# Patient Record
Sex: Female | Born: 1960 | Race: White | Hispanic: No | Marital: Married | Smoking: Current some day smoker
Health system: Southern US, Community
[De-identification: ages and names within clinical notes are randomized; demographics above are authoritative.]

## PROBLEM LIST (undated history)

## (undated) DIAGNOSIS — Z6841 Body Mass Index (BMI) 40.0 and over, adult: Secondary | ICD-10-CM

## (undated) DIAGNOSIS — I493 Ventricular premature depolarization: Secondary | ICD-10-CM

## (undated) DIAGNOSIS — E785 Hyperlipidemia, unspecified: Secondary | ICD-10-CM

## (undated) DIAGNOSIS — J189 Pneumonia, unspecified organism: Secondary | ICD-10-CM

## (undated) DIAGNOSIS — G4730 Sleep apnea, unspecified: Secondary | ICD-10-CM

## (undated) DIAGNOSIS — K219 Gastro-esophageal reflux disease without esophagitis: Secondary | ICD-10-CM

## (undated) DIAGNOSIS — I82409 Acute embolism and thrombosis of unspecified deep veins of unspecified lower extremity: Secondary | ICD-10-CM

## (undated) HISTORY — DX: Ventricular premature depolarization: I49.3

## (undated) HISTORY — DX: Body Mass Index (BMI) 40.0 and over, adult: Z684

## (undated) HISTORY — DX: Morbid (severe) obesity due to excess calories: E66.01

## (undated) HISTORY — DX: Pneumonia, unspecified organism: J18.9

## (undated) HISTORY — DX: Gastro-esophageal reflux disease without esophagitis: K21.9

## (undated) HISTORY — DX: Sleep apnea, unspecified: G47.30

## (undated) HISTORY — DX: Acute embolism and thrombosis of unspecified deep veins of unspecified lower extremity: I82.409

## (undated) HISTORY — PX: RHINOPLASTY: SUR1284

## (undated) HISTORY — DX: Hyperlipidemia, unspecified: E78.5

## (undated) HISTORY — PX: SEPTOPLASTY: SUR1290

---

## 2003-07-26 HISTORY — PX: ABDOMINAL HYSTERECTOMY: SHX81

## 2014-02-22 DIAGNOSIS — I82409 Acute embolism and thrombosis of unspecified deep veins of unspecified lower extremity: Secondary | ICD-10-CM

## 2014-02-22 DIAGNOSIS — J189 Pneumonia, unspecified organism: Secondary | ICD-10-CM

## 2014-02-22 HISTORY — DX: Pneumonia, unspecified organism: J18.9

## 2014-02-22 HISTORY — DX: Acute embolism and thrombosis of unspecified deep veins of unspecified lower extremity: I82.409

## 2015-08-06 ENCOUNTER — Ambulatory Visit (INDEPENDENT_AMBULATORY_CARE_PROVIDER_SITE_OTHER): Payer: PRIVATE HEALTH INSURANCE

## 2015-08-06 ENCOUNTER — Ambulatory Visit (INDEPENDENT_AMBULATORY_CARE_PROVIDER_SITE_OTHER): Payer: PRIVATE HEALTH INSURANCE | Admitting: Family Medicine

## 2015-08-06 ENCOUNTER — Encounter: Payer: Self-pay | Admitting: Family Medicine

## 2015-08-06 VITALS — BP 122/86 | HR 82 | Temp 97.8°F | Resp 16 | Ht 67.0 in | Wt 273.2 lb

## 2015-08-06 DIAGNOSIS — R059 Cough, unspecified: Secondary | ICD-10-CM

## 2015-08-06 DIAGNOSIS — R05 Cough: Secondary | ICD-10-CM

## 2015-08-06 DIAGNOSIS — Z1211 Encounter for screening for malignant neoplasm of colon: Secondary | ICD-10-CM

## 2015-08-06 DIAGNOSIS — Z8249 Family history of ischemic heart disease and other diseases of the circulatory system: Secondary | ICD-10-CM | POA: Diagnosis not present

## 2015-08-06 DIAGNOSIS — I82221 Chronic embolism and thrombosis of inferior vena cava: Secondary | ICD-10-CM

## 2015-08-06 DIAGNOSIS — Z1231 Encounter for screening mammogram for malignant neoplasm of breast: Secondary | ICD-10-CM | POA: Diagnosis not present

## 2015-08-06 DIAGNOSIS — K219 Gastro-esophageal reflux disease without esophagitis: Secondary | ICD-10-CM

## 2015-08-06 DIAGNOSIS — J22 Unspecified acute lower respiratory infection: Secondary | ICD-10-CM

## 2015-08-06 DIAGNOSIS — Z13220 Encounter for screening for lipoid disorders: Secondary | ICD-10-CM

## 2015-08-06 DIAGNOSIS — J9801 Acute bronchospasm: Secondary | ICD-10-CM

## 2015-08-06 DIAGNOSIS — I493 Ventricular premature depolarization: Secondary | ICD-10-CM

## 2015-08-06 DIAGNOSIS — Z720 Tobacco use: Secondary | ICD-10-CM | POA: Diagnosis not present

## 2015-08-06 LAB — LIPID PANEL
Cholesterol: 258 mg/dL — ABNORMAL HIGH (ref 125–200)
HDL: 36 mg/dL — ABNORMAL LOW (ref >=46)
LDL Cholesterol: 188 mg/dL — ABNORMAL HIGH (ref <130)
Total CHOL/HDL Ratio: 7.2 Ratio — ABNORMAL HIGH (ref <=5.0)
Triglycerides: 168 mg/dL — ABNORMAL HIGH (ref <150)
VLDL: 34 mg/dL — AB (ref <30)

## 2015-08-06 LAB — COMPREHENSIVE METABOLIC PANEL
ALT: 15 U/L (ref 6–29)
AST: 16 U/L (ref 10–35)
Albumin: 4 g/dL (ref 3.6–5.1)
Alkaline Phosphatase: 60 U/L (ref 33–130)
BUN: 8 mg/dL (ref 7–25)
CHLORIDE: 106 mmol/L (ref 98–110)
CO2: 27 mmol/L (ref 20–31)
CREATININE: 0.65 mg/dL (ref 0.50–1.05)
Calcium: 9.4 mg/dL (ref 8.6–10.4)
GLUCOSE: 96 mg/dL (ref 65–99)
POTASSIUM: 4.6 mmol/L (ref 3.5–5.3)
SODIUM: 139 mmol/L (ref 135–146)
TOTAL PROTEIN: 6.7 g/dL (ref 6.1–8.1)
Total Bilirubin: 0.4 mg/dL (ref 0.2–1.2)

## 2015-08-06 LAB — TSH: TSH: 1.468 u[IU]/mL (ref 0.350–4.500)

## 2015-08-06 LAB — POCT CBC
Granulocyte percent: 60.8 %G (ref 37–80)
HCT, POC: 39.6 % (ref 37.7–47.9)
Hemoglobin: 13.6 g/dL (ref 12.2–16.2)
LYMPH, POC: 1.9 (ref 0.6–3.4)
MCH: 32.1 pg — AB (ref 27–31.2)
MCHC: 34.4 g/dL (ref 31.8–35.4)
MCV: 93.1 fL (ref 80–97)
MID (cbc): 0.6 (ref 0–0.9)
MPV: 7.3 fL (ref 0–99.8)
POC GRANULOCYTE: 4 (ref 2–6.9)
POC LYMPH PERCENT: 29.4 %L (ref 10–50)
POC MID %: 9.8 %M (ref 0–12)
Platelet Count, POC: 260 10*3/uL (ref 142–424)
RBC: 4.25 M/uL (ref 4.04–5.48)
RDW, POC: 13.3 %
WBC: 6.5 10*3/uL (ref 4.6–10.2)

## 2015-08-06 MED ORDER — HYDROCOD POLST-CPM POLST ER 10-8 MG/5ML PO SUER: 5.0000 mL | Freq: Two times a day (BID) | ORAL | Status: DC | PRN

## 2015-08-06 MED ORDER — PANTOPRAZOLE SODIUM 20 MG PO TBEC: 20.0000 mg | DELAYED_RELEASE_TABLET | Freq: Every day | ORAL | Status: DC

## 2015-08-06 MED ORDER — PREDNISONE 20 MG PO TABS: ORAL_TABLET | ORAL | Status: DC

## 2015-08-06 MED ORDER — METOPROLOL TARTRATE 25 MG PO TABS: 12.5000 mg | ORAL_TABLET | Freq: Two times a day (BID) | ORAL | Status: DC

## 2015-08-06 MED ORDER — ALBUTEROL SULFATE (2.5 MG/3ML) 0.083% IN NEBU
2.5000 mg | INHALATION_SOLUTION | Freq: Once | RESPIRATORY_TRACT | Status: AC
  Administered 2015-08-06: 2.5 mg via RESPIRATORY_TRACT

## 2015-08-06 MED ORDER — APIXABAN 5 MG PO TABS: 5.0000 mg | ORAL_TABLET | Freq: Two times a day (BID) | ORAL | Status: DC

## 2015-08-06 MED ORDER — DOXYCYCLINE HYCLATE 100 MG PO CAPS: 100.0000 mg | ORAL_CAPSULE | Freq: Two times a day (BID) | ORAL | Status: DC

## 2015-08-06 MED ORDER — ALBUTEROL SULFATE HFA 108 (90 BASE) MCG/ACT IN AERS: 2.0000 | INHALATION_SPRAY | Freq: Four times a day (QID) | RESPIRATORY_TRACT | Status: DC | PRN

## 2015-08-06 NOTE — Patient Instructions (Signed)

## 2015-08-06 NOTE — Progress Notes (Signed)
Subjective:    Patient ID: Leah Blevins, female    DOB: [birth year], 55 y.o.   MRN: [id number]  08/06/2015  Nasal Congestion; Headache; and Cough   HPI This 55 y.o. female presents for evaluation of nasal congestion and cough.  History of pneumonia with hospitalization in the past; thus, very cautious when gets ill.   Onset four days ago.  No fever but +chills/sweats.  Had a hot tottie last night to help with cough.  +HA.  +ear pain B; +sore throat.  Pain with swallowing.  +coughing with pain in upper airways which is really scaring patient.  +rhinorrhea; +nasal congestion green mostly. +loud cough; brown rusty color.  No bloody sputum.  +SOB; +Wheezing. No asthma or COPD/emphysema.  +tobacco x currently; total smoking 24 years.  No n/v/d.   S/p flu vaccine and pneumovax in 2015.  Has municex without much improvement.  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  Has been maintained on Eliquis for 1.5 years.  Taking Eliquis bid.  Mother with blood clots/DVT; maternal grandmother with pulmonary embolism.  S/p blood clotting disorder evaluation by hematology.  Did not follow-up with hematology. Prescribed bid but only takes 1/2 5mg  bid.  R lasts a long time.  Must be written a specific way.    PVCs: takes Metoprolol.  GERD: protonix for indigestion.  Will be in [place] for one year.  Last physical:  Years ago. 3 years ago Pap smear: Mammogram:  never Colonoscopy:  never TDAP:  Not sure Pneumovax:  2015 Zostavax:  2015 Influenza:  Not this year.  Review of Systems  Constitutional: Negative for fever, chills, diaphoresis and fatigue.  HENT: Positive for congestion, postnasal drip, rhinorrhea, sore throat and voice change. Negative for ear pain, sinus pressure and trouble swallowing.   Eyes: Negative for visual disturbance.  Respiratory: Positive for cough, shortness of breath and wheezing.   Cardiovascular: Negative for chest pain, palpitations and leg  swelling.  Gastrointestinal: Negative for nausea, vomiting, abdominal pain, diarrhea and constipation.  Endocrine: Negative for cold intolerance, heat intolerance, polydipsia, polyphagia and polyuria.  Skin: Negative for rash.  Neurological: Positive for headaches. Negative for dizziness, tremors, seizures, syncope, facial asymmetry, speech difficulty, weakness, light-headedness and numbness.    Past Medical History  Diagnosis Date  . Pneumonia 02/2014  . DVT (deep venous thrombosis) ([place]) 02/2014  . PVC (premature ventricular contraction)   . GERD (gastroesophageal reflux disease)    Past Surgical History  Procedure Laterality Date  . Cesarean section  1993  . Rhinoplasty    . Septoplasty Bilateral   . Abdominal hysterectomy  2005    uterine fibroids/DUB.  ovaries removed.   Allergies  Allergen Reactions  . Morphine And Related Nausea And Vomiting  . Penicillins Hives and Itching    Social History   Social History  . Marital Status: Married    Spouse Name: N/A  . Number of Children: N/A  . Years of Education: N/A   Occupational History  . Not on file.   Social History Main Topics  . Smoking status: Current Every Day Smoker -- 0.50 packs/day for 40 years    Types: Cigarettes  . Smokeless tobacco: Not on file  . Alcohol Use: 0.0 oz/week    0 Standard drinks or equivalent per week     Comment: sometimes- beer  . Drug Use: No  . Sexual Activity: Yes   Other Topics Concern  . Not on file   Social History Narrative  Marital status:  Married      Children: 24 year old son; no grandchildren.      Lives: with husband; travels in [place]; home is [place].       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      Tobacco: 1 ppd x 23 years      Alcohol: rarely; beers      Exercise:     Family History  Problem Relation Age of Onset  . Diabetes Mother   . Heart disease Mother 90    CABG/CAD  . Hyperlipidemia Mother   .  Hypertension Mother   . Cancer Father     vocal cords  . Heart disease Father 75    CABG  . Hyperlipidemia Father   . Hypertension Father   . Cancer Brother     vocal cords  . Heart disease Brother 40    CABG/CAD/stents       Objective:    BP 122/86 mmHg  Pulse 82  Temp(Src) 97.8 F (36.6 C) (Oral)  Resp 16  Ht 5\' 7"  (1.702 m)  Wt 273 lb 3.2 oz (123.923 kg)  BMI 42.78 kg/m2  SpO2 97% Physical Exam  Constitutional: She is oriented to person, place, and time. She appears well-developed and well-nourished. No distress.  obesity  HENT:  Head: Normocephalic and atraumatic.  Right Ear: External ear normal.  Left Ear: External ear normal.  Nose: Mucosal edema and rhinorrhea present. Right sinus exhibits no maxillary sinus tenderness and no frontal sinus tenderness. Left sinus exhibits no maxillary sinus tenderness and no frontal sinus tenderness.  Mouth/Throat: Uvula is midline, oropharynx is clear and moist and mucous membranes are normal.  +PND  Eyes: Conjunctivae and EOM are normal. Pupils are equal, round, and reactive to light.  Neck: Normal range of motion. Neck supple. Carotid bruit is not present. No thyromegaly present.  Cardiovascular: Normal rate, regular rhythm, normal heart sounds and intact distal pulses.  Exam reveals no gallop and no friction rub.   No murmur heard. Pulmonary/Chest: Effort normal and breath sounds normal. She has no wheezes. She has no rales.  Abdominal: Soft. Bowel sounds are normal. She exhibits no distension and no mass. There is no tenderness. There is no rebound and no guarding.  Lymphadenopathy:    She has no cervical adenopathy.  Neurological: She is alert and oriented to person, place, and time. No cranial nerve deficit.  Skin: Skin is warm and dry. No rash noted. She is not diaphoretic. No erythema. No pallor.  Psychiatric: She has a normal mood and affect. Her behavior is normal.   Results for orders placed or performed in visit on  08/06/15  POCT CBC  Result Value Ref Range   WBC 6.5 4.6 - 10.2 K/uL   Lymph, poc 1.9 0.6 - 3.4   POC LYMPH PERCENT 29.4 10 - 50 %L   MID (cbc) 0.6 0 - 0.9   POC MID % 9.8 0 - 12 %M   POC Granulocyte 4.0 2 - 6.9   Granulocyte percent 60.8 37 - 80 %G   RBC 4.25 4.04 - 5.48 M/uL   Hemoglobin 13.6 12.2 - 16.2 g/dL   HCT, POC 39.6 37.7 - 47.9 %   MCV 93.1 80 - 97 fL   MCH, POC 32.1 (A) 27 - 31.2 pg   MCHC 34.4 31.8 - 35.4 g/dL   RDW, POC 13.3 %   Platelet Count,  POC 260 142 - 424 K/uL   MPV 7.3 0 - 99.8 fL   [facility] reading (PRIMARY) by  Dr. Tamala Julian. CXR: NAD  ALBUTEROL NEBULIZER.      Assessment & Plan:   1. Cough   2. PVC (premature ventricular contraction)   3. Gastroesophageal reflux disease without esophagitis   4. 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 Screening, lipid   6. Family history of cardiovascular disease   7. Tobacco abuse   8. Bronchospasm   9. Lower respiratory infection   10. Colon cancer screening   11. Encounter for screening mammogram for breast cancer     1. Lower respiratory infection: New.  Rx for Doxycycline, Prednisone, Albuterol, and Tussionex provided. 2.  PVCs: stable; refill of Metoprolol provide.d 3.  GERD: controlled; refill provided. 4.  DVT inferior vena cava: Stable; refill provided.  Agreeable to lifelong anticoagulation due to mother and maternal grandmother with pulmonary embolism/DVT.  Reported negative evaluation in past for coagulopathy. 5.  Screening lipid: obtain FLP. 6.  Tobacco abuse: Highly encourage cessation. 7.  Family history of cardiovascular disease early: highly recommend smoking cessation, aggressive control of lipids, etc. 8.  Colon cancer screening: refer for colonoscopy. 9.  Schedule mammogram.   Orders Placed This Encounter  Procedures  . DG Chest 2 View    Standing Status: Future     Number of Occurrences: 1     Standing Expiration Date: 08/05/2016    Order Specific Question:   Reason for Exam (SYMPTOM  OR DIAGNOSIS REQUIRED)    Answer:  cough, DOE, history of pneumonia    Order Specific Question:  Is the patient pregnant?    Answer:  No    Order Specific Question:  Preferred imaging location?    Answer:  External  . MM Digital Screening    Standing Status: Future     Number of Occurrences:      Standing Expiration Date: 10/03/2016    Order Specific Question:  Reason for Exam (SYMPTOM  OR DIAGNOSIS REQUIRED)    Answer:  annual screening    Order Specific Question:  Is the patient pregnant?    Answer:  No    Order Specific Question:  Preferred imaging location?    Answer:  [facility]  . Comprehensive metabolic panel    Order Specific Question:  Has the patient fasted?    Answer:  Yes  . TSH  . Lipid panel    Order Specific Question:  Has the patient fasted?    Answer:  Yes  . Ambulatory referral to Gastroenterology    Referral Priority:  Routine    Referral Type:  Consultation    Referral Reason:  Specialty Services Required    Number of Visits Requested:  1  . POCT CBC   Meds ordered this encounter  Medications  . DISCONTD: apixaban (ELIQUIS) 2.5 MG TABS tablet    Sig: Take 2.5 mg by mouth 2 (two) times daily.  . Prenatal Vit-Fe Fumarate-FA (PRENATAL VITAMIN PO)    Sig: Take by mouth daily.  Marland Kitchen DISCONTD: pantoprazole (PROTONIX) 20 MG tablet    Sig: Take 20 mg by mouth daily.  . metoprolol succinate (TOPROL-XL) 25 MG 24 hr tablet    Sig: Take 12.5 mg by mouth 2 (two) times daily.  Marland Kitchen OVER THE COUNTER MEDICATION    Sig: 2 (two) times daily.  Marland Kitchen OVER THE COUNTER MEDICATION    Sig: daily.  Marland Kitchen albuterol (PROVENTIL) (2.5  MG/3ML) 0.083% nebulizer solution 2.5 mg    Sig:   . cyanocobalamin (,VITAMIN B-12,) 1000 MCG/ML injection    Sig: INJECT 1ML ONCE MONTHLY    Refill:  0  . phentermine (ADIPEX-P) 37.5 MG tablet    Sig: Take 37.5 mg by mouth daily.    Refill:  1  . CHANTIX 0.5 MG tablet    Sig: Take 0.5 mg by mouth 2 (two) times daily.     Refill:  0  . doxycycline (VIBRAMYCIN) 100 MG capsule    Sig: Take 1 capsule (100 mg total) by mouth 2 (two) times daily.    Dispense:  20 capsule    Refill:  0  . predniSONE (DELTASONE) 20 MG tablet    Sig: Three tablets daily x 2 days then two tablets daily x 5 days then one tablet daily x 5 days    Dispense:  21 tablet    Refill:  0  . chlorpheniramine-HYDROcodone (TUSSIONEX PENNKINETIC ER) 10-8 MG/5ML SUER    Sig: Take 5 mLs by mouth every 12 (twelve) hours as needed for cough.    Dispense:  180 mL    Refill:  0  . albuterol (PROVENTIL HFA;VENTOLIN HFA) 108 (90 Base) MCG/ACT inhaler    Sig: Inhale 2 puffs into the lungs every 6 (six) hours as needed for wheezing or shortness of breath (cough, shortness of breath or wheezing.).    Dispense:  1 Inhaler    Refill:  1  . DISCONTD: pantoprazole (PROTONIX) 20 MG tablet    Sig: Take 1 tablet (20 mg total) by mouth daily.    Dispense:  90 tablet    Refill:  3  . DISCONTD: metoprolol tartrate (LOPRESSOR) 25 MG tablet    Sig: Take 0.5 tablets (12.5 mg total) by mouth 2 (two) times daily.    Dispense:  90 tablet    Refill:  1  . DISCONTD: apixaban (ELIQUIS) 5 MG TABS tablet    Sig: Take 1 tablet (5 mg total) by mouth 2 (two) times daily.    Dispense:  180 tablet    Refill:  1  . apixaban (ELIQUIS) 5 MG TABS tablet    Sig: Take 1 tablet (5 mg total) by mouth 2 (two) times daily.    Dispense:  180 tablet    Refill:  1  . pantoprazole (PROTONIX) 20 MG tablet    Sig: Take 1 tablet (20 mg total) by mouth daily.    Dispense:  90 tablet    Refill:  3  . metoprolol tartrate (LOPRESSOR) 25 MG tablet    Sig: Take 0.5 tablets (12.5 mg total) by mouth 2 (two) times daily.    Dispense:  90 tablet    Refill:  1    No Follow-up on file.    Zeanna Sunde Elayne Guerin, M.D. [facility] [address] [place], [place]  [address] [phone] phone [phone] fax

## 2015-08-07 ENCOUNTER — Other Ambulatory Visit: Payer: Self-pay

## 2015-08-07 DIAGNOSIS — Z1231 Encounter for screening mammogram for malignant neoplasm of breast: Secondary | ICD-10-CM

## 2015-08-18 MED ORDER — ATORVASTATIN CALCIUM 10 MG PO TABS: 10.0000 mg | ORAL_TABLET | Freq: Every day | ORAL | Status: DC

## 2015-08-18 NOTE — Addendum Note (Signed)
Addended by: Wardell Honour on: 08/18/2015 01:14 PM   Modules accepted: Orders

## 2015-08-19 ENCOUNTER — Other Ambulatory Visit: Payer: Self-pay

## 2015-08-19 DIAGNOSIS — Z1231 Encounter for screening mammogram for malignant neoplasm of breast: Secondary | ICD-10-CM

## 2015-08-20 ENCOUNTER — Encounter: Payer: Self-pay | Admitting: Gastroenterology

## 2015-08-31 ENCOUNTER — Telehealth: Payer: Self-pay

## 2015-08-31 ENCOUNTER — Encounter: Payer: Self-pay | Admitting: Gastroenterology

## 2015-08-31 ENCOUNTER — Ambulatory Visit (INDEPENDENT_AMBULATORY_CARE_PROVIDER_SITE_OTHER): Payer: No Typology Code available for payment source | Admitting: Gastroenterology

## 2015-08-31 VITALS — BP 104/70 | HR 88 | Ht 67.0 in | Wt 267.4 lb

## 2015-08-31 DIAGNOSIS — Z1211 Encounter for screening for malignant neoplasm of colon: Secondary | ICD-10-CM | POA: Diagnosis not present

## 2015-08-31 MED ORDER — NA SULFATE-K SULFATE-MG SULF 17.5-3.13-1.6 GM/177ML PO SOLN: 1.0000 | Freq: Once | ORAL | Status: DC

## 2015-08-31 NOTE — Patient Instructions (Signed)
You will be set up for a colonoscopy for colon cancer screening. We will ask Dr. Bing Quarry about holding your eliquis for 2 days prior to the colonoscopy.

## 2015-08-31 NOTE — Telephone Encounter (Signed)
08/31/2015   RE: Leah Blevins DOB: 06-Sep-1960 MRN: [id number]   Dear Dr Tamala Julian,    We have scheduled the above patient for an endoscopic procedure. Our records show that she is on anticoagulation therapy.   Please advise as to how long the patient may come off her therapy of Eliquis prior to the procedure, which is scheduled for 10/19/15.  Please fax back/ or route to Perez Dirico at [phone].   Sincerely,    Christian Mate RN

## 2015-08-31 NOTE — Progress Notes (Signed)
HPI: This is a   very pleasant 55 year old woman   who was referred to me by Wardell Honour, MD  to evaluate  colon cancer screening .    Chief complaint is routine risk for colon cancer.  Mothers identical twin sister had colon cancer in early   No GI  bleeding,   No constipation or loose stools.  Smokes 1/2 pack per day.  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  She is on elquis since then.  This has been prescribed by a [place] PCP of hers.  She was sent to hematologist in [facility] and blood clotting workup done, it seems no specific blood clotting disorder was diagnosed however given her mother's history of blood clots she was recommended to remain on a blood thinner indefinitely.   Review of systems: Pertinent positive and negative review of systems were noted in the above HPI section. Complete review of systems was performed and was otherwise normal.   Past Medical History  Diagnosis Date  . Pneumonia 02/2014  . DVT (deep venous thrombosis) ([place]) 02/2014  . PVC (premature ventricular contraction)   . GERD (gastroesophageal reflux disease)   . HLD (hyperlipidemia)   . Sleep apnea   . Morbid obesity with BMI of 40.0-44.9, adult [facility])     Past Surgical History  Procedure Laterality Date  . Cesarean section  1993  . Rhinoplasty    . Septoplasty Bilateral   . Abdominal hysterectomy  2005    uterine fibroids/DUB.  ovaries removed.    Current Outpatient Prescriptions  Medication Sig Dispense Refill  . apixaban (ELIQUIS) 5 MG TABS tablet Take 1 tablet (5 mg total) by mouth 2 (two) times daily. 180 tablet 1  . atorvastatin (LIPITOR) 10 MG tablet Take 1 tablet (10 mg total) by mouth daily. 90 tablet 3  . CHANTIX 0.5 MG tablet Take 0.5 mg by mouth 2 (two) times daily.  0  . metoprolol tartrate (LOPRESSOR) 25 MG tablet Take 0.5 tablets (12.5 mg total) by mouth 2 (two) times daily. 90 tablet 1  . pantoprazole (PROTONIX) 20 MG tablet Take 1 tablet (20 mg total) by  mouth daily. 90 tablet 3  . Prenatal Vit-Fe Fumarate-FA (PRENATAL VITAMIN PO) Take by mouth daily.     No current facility-administered medications for this visit.    Allergies as of 08/31/2015 - Review Complete 08/31/2015  Allergen Reaction Noted  . Keflex [cephalexin] Hives 08/31/2015  . Morphine and related Nausea And Vomiting 08/06/2015  . Penicillins Hives and Itching 08/06/2015    Family History  Problem Relation Age of Onset  . Diabetes Mother   . Heart disease Mother 38    CABG/CAD  . Hyperlipidemia Mother   . Hypertension Mother   . Cancer Father     vocal cords  . Heart disease Father 76    CABG  . Hyperlipidemia Father   . Hypertension Father   . Cancer Brother     vocal cords  . Heart disease Brother 40    CABG/CAD/stents  . Colon cancer Maternal Aunt   . Colon polyps Father   . Colon polyps Mother   . Clotting disorder Mother     Social History   Social History  . Marital Status: Married    Spouse Name: N/A  . Number of Children: 1  . Years of Education: N/A   Occupational History  . workamper    Social History Main Topics  . Smoking status: Current Every  Day Smoker -- 0.50 packs/day for 40 years    Types: Cigarettes  . Smokeless tobacco: Never Used     Comment: vapor sig  . Alcohol Use: 0.0 oz/week    0 Standard drinks or equivalent per week     Comment: sometimes- beer  . Drug Use: No  . Sexual Activity: Yes   Other Topics Concern  . Not on file   Social History Narrative   Marital status:  Married      Children: 76 year old son; no grandchildren.      Lives: with husband; travels in [place]; home is [place].       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      Tobacco: 1 ppd x 23 years      Alcohol: rarely; beers      Exercise:       Physical Exam: BP 104/70 mmHg  Pulse 88  Ht 5\' 7"  (1.702 m)  Wt 267 lb 6 oz (121.281 kg)  BMI 41.87 kg/m2 Constitutional: generally  well-appearing Psychiatric: alert and oriented x3 Eyes: extraocular movements intact Mouth: oral pharynx moist, no lesions Neck: supple no lymphadenopathy Cardiovascular: heart regular rate and rhythm Lungs: clear to auscultation bilaterally Abdomen: soft, nontender, nondistended, no obvious ascites, no peritoneal signs, normal bowel sounds Extremities: no lower extremity edema bilaterally Skin: no lesions on visible extremities   Assessment and plan: 55 y.o. female with  routine risk for colon cancer, ongoing blood thinner use, morbid obesity  She is at routine risk for colon cancer however she is on a blood thinner and she understands that remaining on this blood thinner with protracted increased risk for procedural complications during a colonoscopy. We will communicate with her primary care physician about the safety of her stopping the blood thinner for 2 days prior to colonoscopy. I see no reason for any further blood tests or imaging studies prior to then.   Owens Loffler, MD [facility] 08/31/2015, 2:54 PM  Cc: Wardell Honour, MD

## 2015-09-01 ENCOUNTER — Ambulatory Visit
Admission: RE | Admit: 2015-09-01 | Discharge: 2015-09-01 | Disposition: A | Discharge status: Home / Self Care | Payer: No Typology Code available for payment source | Source: Ambulatory Visit | Attending: Family Medicine | Admitting: Family Medicine

## 2015-09-01 DIAGNOSIS — Z1231 Encounter for screening mammogram for malignant neoplasm of breast: Secondary | ICD-10-CM

## 2015-09-03 NOTE — Telephone Encounter (Signed)
Left message on machine to call back  

## 2015-09-03 NOTE — Telephone Encounter (Signed)
The patient has been notified of this information and all questions answered.

## 2015-09-03 NOTE — Telephone Encounter (Signed)
Please have patient HOLD Eliquis one day before procedure; thus, last dose of Eliquis should be 2 days before procedure.  Patient should reinitiate Eliquis 48 hours after procedure.

## 2015-09-16 ENCOUNTER — Telehealth: Payer: Self-pay

## 2015-09-16 NOTE — Telephone Encounter (Signed)
Patient is calling because she was seen in January for Bronchitis and states that the symptoms are starting to come back. She is coughing and has green mucus. Patient wants to know if she could get more antibiotic and cough medication.

## 2015-10-19 ENCOUNTER — Encounter: Payer: Self-pay | Admitting: Gastroenterology

## 2015-10-19 ENCOUNTER — Ambulatory Visit (AMBULATORY_SURGERY_CENTER): Payer: No Typology Code available for payment source | Admitting: Gastroenterology

## 2015-10-19 VITALS — BP 123/80 | HR 70 | Temp 98.0°F | Resp 13 | Ht 67.0 in | Wt 267.0 lb

## 2015-10-19 DIAGNOSIS — K635 Polyp of colon: Secondary | ICD-10-CM

## 2015-10-19 DIAGNOSIS — D124 Benign neoplasm of descending colon: Secondary | ICD-10-CM

## 2015-10-19 DIAGNOSIS — Z1211 Encounter for screening for malignant neoplasm of colon: Secondary | ICD-10-CM | POA: Diagnosis present

## 2015-10-19 DIAGNOSIS — D125 Benign neoplasm of sigmoid colon: Secondary | ICD-10-CM

## 2015-10-19 DIAGNOSIS — D123 Benign neoplasm of transverse colon: Secondary | ICD-10-CM | POA: Diagnosis not present

## 2015-10-19 MED ORDER — SODIUM CHLORIDE 0.9 % IV SOLN: 500.0000 mL | INTRAVENOUS | Status: DC

## 2015-10-19 NOTE — Progress Notes (Signed)
Called to room to assist during endoscopic procedure.  Patient ID and intended procedure confirmed with present staff. Received instructions for my participation in the procedure from the performing physician.  

## 2015-10-19 NOTE — Progress Notes (Signed)
To PACU  Pt alert and awake. Report to RN

## 2015-10-19 NOTE — Op Note (Signed)
[facility] Patient Name: Leah Blevins Procedure Date: 10/19/2015 10:32 AM MRN: [id number] Endoscopist: Milus Banister , MD Age: 55 Referring MD:  Date of Birth: 02-Aug-1960 Gender: Female Procedure:                Colonoscopy Indications:              Screening for colorectal malignant neoplasm Medicines:                Monitored Anesthesia Care Procedure:                Pre-Anesthesia Assessment:                           - Prior to the procedure, a History and Physical                            was performed, and patient medications and                            allergies were reviewed. The patient's tolerance of                            previous anesthesia was also reviewed. The risks                            and benefits of the procedure and the sedation                            options and risks were discussed with the patient.                            All questions were answered, and informed consent                            was obtained. Prior Anticoagulants: The patient has                            taken Eliquis (apixaban). ASA Grade Assessment: II                            - A patient with mild systemic disease. After                            reviewing the risks and benefits, the patient was                            deemed in satisfactory condition to undergo the                            procedure.                           After obtaining informed consent, the colonoscope  was passed under direct vision. Throughout the                            procedure, the patient's blood pressure, pulse, and                            oxygen saturations were monitored continuously. The                            Model CF-HQ190L [id number]) scope was introduced                            through the anus and advanced to the the cecum,                            identified by appendiceal orifice and ileocecal     valve. The colonoscopy was performed without                            difficulty. The patient tolerated the procedure                            well. The quality of the bowel preparation was                            excellent. The ileocecal valve, appendiceal                            orifice, and rectum were photographed. Scope In: 10:55:22 AM Scope Out: 11:12:47 AM Scope Withdrawal Time: 0 hours 14 minutes 27 seconds  Total Procedure Duration: 0 hours 17 minutes 25 seconds  Findings:      A 17 mm polyp was found in the transverse colon. The polyp was       semi-pedunculated. The polyp was removed with a hot snare. Resection and       retrieval were complete (jar 1). Area was successfully injected with       Spot (carbon black) for tattooing to aid in future localization.      Three sessile polyps were found in the sigmoid colon and descending       colon. The polyps were 4 to 10 mm in size. These polyps were removed       with a cold snare. Resection and retrieval were complete (jar 2).      Multiple small and large-mouthed diverticula were found in the entire       colon.      The exam was otherwise without abnormality on direct and retroflexion       views. Complications:            No immediate complications. Estimated Blood Loss:     Estimated blood loss: none. Impression:               - One 17 mm polyp in the transverse colon, removed                            with a hot snare. Resected and retrieved. Injected.                           -  Three 4 to 10 mm polyps in the sigmoid colon and                            in the descending colon, removed with a cold snare.                            Resected and retrieved.                           - Diverticulosis in the entire examined colon.                           - The examination was otherwise normal on direct                            and retroflexion views. Recommendation:           - Patient has a contact number  available for                            emergencies. The signs and symptoms of potential                            delayed complications were discussed with the                            patient. Return to normal activities tomorrow.                            Written discharge instructions were provided to the                            patient.                           - Resume previous diet.                           - Resume Eliquis (apixaban) at prior dose in 2 days.                           - Repeat colonoscopy is recommended. The                            colonoscopy date will be determined after pathology                            results from today's exam become available for                            review. Procedure Code(s):        --- Professional ---                           [phone], Colonoscopy, flexible; with removal of  tumor(s), polyp(s), or other lesion(s) by snare                            technique                           [id number], Colonoscopy, flexible; with directed                            submucosal injection(s), any substance CPT copyright 2016 American Medical Association. All rights reserved. Milus Banister, MD 10/19/2015 11:18:48 AM This report has been signed electronically. Number of Addenda: 0 Referring MD:      Kincius Theda Sers, MD

## 2015-10-19 NOTE — Patient Instructions (Signed)
YOU HAD AN ENDOSCOPIC PROCEDURE TODAY AT [facility]:   Refer to the procedure report that was given to you for any specific questions about what was found during the examination.  If the procedure report does not answer your questions, please call your gastroenterologist to clarify.  If you requested that your care partner not be given the details of your procedure findings, then the procedure report has been included in a sealed envelope for you to review at your convenience later.  YOU SHOULD EXPECT: Some feelings of bloating in the abdomen. Passage of more gas than usual.  Walking can help get rid of the air that was put into your GI tract during the procedure and reduce the bloating. If you had a lower endoscopy (such as a colonoscopy or flexible sigmoidoscopy) you may notice spotting of blood in your stool or on the toilet paper. If you underwent a bowel prep for your procedure, you may not have a normal bowel movement for a few days.  Please Note:  You might notice some irritation and congestion in your nose or some drainage.  This is from the oxygen used during your procedure.  There is no need for concern and it should clear up in a day or so.  SYMPTOMS TO REPORT IMMEDIATELY:   Following lower endoscopy (colonoscopy or flexible sigmoidoscopy):  Excessive amounts of blood in the stool  Significant tenderness or worsening of abdominal pains  Swelling of the abdomen that is new, acute  Fever of 100F or higher   For urgent or emergent issues, a gastroenterologist can be reached at any hour by calling [phone].   DIET: Your first meal following the procedure should be a small meal and then it is ok to progress to your normal diet. Heavy or fried foods are harder to digest and may make you feel nauseous or bloated.  Likewise, meals heavy in dairy and vegetables can increase bloating.  Drink plenty of fluids but you should avoid alcoholic beverages for 24  hours.  ACTIVITY:  You should plan to take it easy for the rest of today and you should NOT DRIVE or use heavy machinery until tomorrow (because of the sedation medicines used during the test).    FOLLOW UP: Our staff will call the number listed on your records the next business day following your procedure to check on you and address any questions or concerns that you may have regarding the information given to you following your procedure. If we do not reach you, we will leave a message.  However, if you are feeling well and you are not experiencing any problems, there is no need to return our call.  We will assume that you have returned to your regular daily activities without incident.  If any biopsies were taken you will be contacted by phone or by letter within the next 1-3 weeks.  Please call us at [phone] if you have not heard about the biopsies in 3 weeks.    SIGNATURES/CONFIDENTIALITY: You and/or your care partner have signed paperwork which will be entered into your electronic medical record.  These signatures attest to the fact that that the information above on your After Visit Summary has been reviewed and is understood.  Full responsibility of the confidentiality of this discharge information lies with you and/or your care-partner.  Polyps, diverticulosis, high fiber diet-handouts given  Repeat colonoscopy will be determined by pathology.  Resume Eliquis prescribed dose in 2 days 10/21/15.

## 2015-10-20 ENCOUNTER — Telehealth: Payer: Self-pay

## 2015-10-20 NOTE — Telephone Encounter (Signed)
Attempt post procedure follow up call. No answer, left vm message.

## 2015-10-23 ENCOUNTER — Encounter: Payer: Self-pay | Admitting: Gastroenterology

## 2016-03-05 ENCOUNTER — Ambulatory Visit: Payer: PRIVATE HEALTH INSURANCE

## 2016-03-10 ENCOUNTER — Telehealth: Payer: Self-pay

## 2016-03-10 NOTE — Telephone Encounter (Signed)
Pt was wondering if she could get a refill on her CHANTIX 0.5 MG tablet [id number]. [facility] like [place] [id number] [place], [place] - [address]. Please advise at  [phone]

## 2016-03-12 ENCOUNTER — Telehealth: Payer: Self-pay

## 2016-03-12 NOTE — Telephone Encounter (Signed)
Please contact patient concerning the refill of the Chantix. Pt is about to have a meltdown. She is out and doesn't want to wait for a refill. Please see previous message.

## 2016-03-14 NOTE — Telephone Encounter (Signed)
I don't see where I have ever prescribed Chantix for patient?

## 2016-03-14 NOTE — Telephone Encounter (Signed)
Looks like Dr Tamala Julian may have Rxd it in Nov 2016 (even though it says historical provider in [facility], it does have her as ordering provider when checking more detailed records). I called pt though and advised on VM that she needs to RTC for Med REfill check up, 6 mos f/up is overdue, and can discuss Chantix at that time. Advised to call to get Dr Thompson Caul sch and get details about same day appts.

## 2016-03-15 NOTE — Telephone Encounter (Signed)
See notes under 8/17 message.

## 2016-03-19 ENCOUNTER — Other Ambulatory Visit: Payer: Self-pay | Admitting: Family Medicine

## 2016-04-08 ENCOUNTER — Ambulatory Visit (INDEPENDENT_AMBULATORY_CARE_PROVIDER_SITE_OTHER): Payer: PRIVATE HEALTH INSURANCE | Admitting: Physician Assistant

## 2016-04-08 ENCOUNTER — Ambulatory Visit (INDEPENDENT_AMBULATORY_CARE_PROVIDER_SITE_OTHER): Payer: PRIVATE HEALTH INSURANCE

## 2016-04-08 VITALS — BP 124/76 | HR 81 | Temp 97.8°F | Resp 18 | Ht 67.0 in | Wt 269.0 lb

## 2016-04-08 DIAGNOSIS — R059 Cough, unspecified: Secondary | ICD-10-CM

## 2016-04-08 DIAGNOSIS — F17200 Nicotine dependence, unspecified, uncomplicated: Secondary | ICD-10-CM | POA: Diagnosis not present

## 2016-04-08 DIAGNOSIS — R05 Cough: Secondary | ICD-10-CM

## 2016-04-08 DIAGNOSIS — J209 Acute bronchitis, unspecified: Secondary | ICD-10-CM | POA: Diagnosis not present

## 2016-04-08 DIAGNOSIS — J449 Chronic obstructive pulmonary disease, unspecified: Secondary | ICD-10-CM

## 2016-04-08 LAB — POCT CBC
Granulocyte percent: 55.1 %G (ref 37–80)
HEMATOCRIT: 41.5 % (ref 37.7–47.9)
HEMOGLOBIN: 14.5 g/dL (ref 12.2–16.2)
LYMPH, POC: 2.7 (ref 0.6–3.4)
MCH, POC: 33 pg — AB (ref 27–31.2)
MCHC: 35 g/dL (ref 31.8–35.4)
MCV: 94.2 fL (ref 80–97)
MID (cbc): 0.5 (ref 0–0.9)
MPV: 7.6 fL (ref 0–99.8)
POC GRANULOCYTE: 3.9 (ref 2–6.9)
POC LYMPH %: 37.5 % (ref 10–50)
POC MID %: 7.4 %M (ref 0–12)
Platelet Count, POC: 277 10*3/uL (ref 142–424)
RBC: 4.4 M/uL (ref 4.04–5.48)
RDW, POC: 13 %
WBC: 7.1 10*3/uL (ref 4.6–10.2)

## 2016-04-08 MED ORDER — BENZONATATE 100 MG PO CAPS: 100.0000 mg | ORAL_CAPSULE | Freq: Three times a day (TID) | ORAL | 0 refills | Status: DC | PRN

## 2016-04-08 MED ORDER — ALBUTEROL SULFATE HFA 108 (90 BASE) MCG/ACT IN AERS: 2.0000 | INHALATION_SPRAY | RESPIRATORY_TRACT | 0 refills | Status: DC | PRN

## 2016-04-08 MED ORDER — AZITHROMYCIN 250 MG PO TABS: ORAL_TABLET | ORAL | 0 refills | Status: DC

## 2016-04-08 MED ORDER — VARENICLINE TARTRATE 1 MG PO TABS: 1.0000 mg | ORAL_TABLET | Freq: Two times a day (BID) | ORAL | 1 refills | Status: DC

## 2016-04-08 NOTE — Progress Notes (Signed)
Leah Blevins  MRN: [id number] DOB: 11-25-1960  Subjective:  Leah Blevins is a 55 y.o. female seen in office today for a chief complaint of headache, coughing, sneezing, low grade fever, and sore throat x 1 week. Has associated chills, fatigue, and myalgia. Cough has progressed to a productive cough with brown sputum. Notes the cough is worse at night. Pt has been exposed to a lot of [place]uees these past couple of weeks. Denies hemoptysis and recent travel.  Pt has tried dayquil and nyquil for one week with no relief.   Pt has had a history of double pneumonia in 2015 and ended up in the hospital. She notes that when she had this in 2015, she had very similar symptoms and is very concerned about getting it again. Pt smokes 0.5 pack a day. She has tried Chantix in January. It worked for her but then she stopped using it in March. She would like to try this again.   Review of Systems  Constitutional: Positive for appetite change ( decreased) and diaphoresis.  HENT: Positive for congestion ( green mucus has progressed to brown mucus) and ear pain ( pressure).   Respiratory: Positive for shortness of breath.   Cardiovascular: Negative for chest pain, palpitations and leg swelling.  Gastrointestinal: Negative for abdominal pain, constipation, nausea and vomiting.  Neurological: Positive for dizziness.    Patient Active Problem List   Diagnosis Date Noted  . Family history of cardiovascular disease 08/06/2015  . 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 Gastroesophageal reflux disease without esophagitis 08/06/2015  . PVC (premature ventricular contraction) 08/06/2015  . Tobacco abuse 08/06/2015    Current Outpatient Prescriptions on File Prior to Visit  Medication Sig Dispense Refill  . atorvastatin (LIPITOR) 10 MG tablet Take 1 tablet (10 mg total) by mouth daily. 90 tablet 3  . ELIQUIS 5 MG TABS tablet TAKE 1 TABLET BY MOUTH TWICE A DAY 180  tablet 0  . metoprolol tartrate (LOPRESSOR) 25 MG tablet TAKE 0.5 TABLETS (12.5 MG TOTAL) BY MOUTH 2 (TWO) TIMES DAILY. 90 tablet 0  . pantoprazole (PROTONIX) 20 MG tablet Take 1 tablet (20 mg total) by mouth daily. 90 tablet 3  . Prenatal Vit-Fe Fumarate-FA (PRENATAL VITAMIN PO) Take by mouth daily.     No current facility-administered medications on file prior to visit.     Allergies  Allergen Reactions  . Keflex [Cephalexin] Hives  . Morphine And Related Nausea And Vomiting  . Penicillins Hives and Itching   Social History   Social History  . Marital status: Married    Spouse name: N/A  . Number of children: 1  . Years of education: N/A   Occupational History  . workamper    Social History Main Topics  . Smoking status: Former Smoker    Packs/day: 0.50    Years: 40.00    Types: Cigarettes    Quit date: 09/29/2015  . Smokeless tobacco: Never Used     Comment: vapor sig  . Alcohol use 0.0 oz/week     Comment: sometimes- beer  . Drug use: No  . Sexual activity: Yes   Other Topics Concern  . Not on file   Social History Narrative   Marital status:  Married      Children: 82 year old son; no grandchildren.      Lives: with husband; travels in [place]; home is [place].       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      Tobacco: 1 ppd x 23 years      Alcohol: rarely; beers      Exercise:      Objective:  BP 124/76   Pulse 81   Temp 97.8 F (36.6 C) (Oral)   Resp 18   Ht 5\' 7"  (1.702 m)   Wt 269 lb (122 kg)   SpO2 95%   BMI 42.13 kg/m   Physical Exam  Constitutional: She is oriented to person, place, and time.  Well developed, well nourished female appearing uncomfortable on exam table.    HENT:  Head: Normocephalic and atraumatic.  Right Ear: Tympanic membrane, external ear and ear canal normal.  Left Ear: Tympanic membrane, external ear and ear canal normal.  Nose: Mucosal edema present. Right sinus  exhibits maxillary sinus tenderness. Right sinus exhibits no frontal sinus tenderness. Left sinus exhibits maxillary sinus tenderness. Left sinus exhibits no frontal sinus tenderness.  Mouth/Throat: Uvula is midline and mucous membranes are normal. Posterior oropharyngeal erythema present.  Eyes: Conjunctivae are normal.  Neck: Trachea normal and normal range of motion.  Cardiovascular: Normal rate, regular rhythm, normal heart sounds and normal pulses.   Pulmonary/Chest: Effort normal. She has no wheezes.  Course breath sounds auscultated in posterior and anterior lung fields diffusely  Lymphadenopathy:       Head (right side): No submental, no submandibular, no tonsillar, no preauricular, no posterior auricular and no occipital adenopathy present.       Head (left side): No submental, no submandibular, no tonsillar, no preauricular, no posterior auricular and no occipital adenopathy present.    She has no cervical adenopathy.       Right: No supraclavicular adenopathy present.       Left: No supraclavicular adenopathy present.  Neurological: She is alert and oriented to person, place, and time. Gait normal.  Skin: Skin is warm and dry.  Psychiatric: Affect normal.  Vitals reviewed.   Results for orders placed or performed in visit on 04/08/16 (from the past 24 hour(s))  POCT CBC     Status: Abnormal   Collection Time: 04/08/16 11:12 AM  Result Value Ref Range   WBC 7.1 4.6 - 10.2 K/uL   Lymph, poc 2.7 0.6 - 3.4   POC LYMPH PERCENT 37.5 10 - 50 %L   MID (cbc) 0.5 0 - 0.9   POC MID % 7.4 0 - 12 %M   POC Granulocyte 3.9 2 - 6.9   Granulocyte percent 55.1 37 - 80 %G   RBC 4.40 4.04 - 5.48 M/uL   Hemoglobin 14.5 12.2 - 16.2 g/dL   HCT, POC 41.5 37.7 - 47.9 %   MCV 94.2 80 - 97 fL   MCH, POC 33.0 (A) 27 - 31.2 pg   MCHC 35.0 31.8 - 35.4 g/dL   RDW, POC 13.0 %   Platelet Count, POC 277 142 - 424 K/uL   MPV 7.6 0 - 99.8 fL    Dg Chest 2 View  Result Date: 04/08/2016 CLINICAL  DATA:  Worsening cough for 1 week. EXAM: CHEST  2 VIEW COMPARISON:  PA and lateral chest 08/06/2015. FINDINGS: The chest is hyperexpanded but the lungs are clear. Heart size is normal. No pneumothorax or pleural effusion. Aortic atherosclerosis is noted. No focal bony abnormality. IMPRESSION: No acute disease. COPD. Atherosclerosis. Electronically Signed   By: Inge Rise M.D.   On: 04/08/2016 11:24  Assessment and Plan :  1. Cough - DG Chest 2 View - POCT CBC - benzonatate (TESSALON) 100 MG capsule; Take 1-2 capsules (100-200 mg total) by mouth 3 (three) times daily as needed for cough.  Dispense: 40 capsule; Refill: 0  2. Tobacco use disorder - varenicline (CHANTIX) 1 MG tablet; Take 1 tablet (1 mg total) by mouth 2 (two) times daily. 0.5 mg PO qd x3 days, then 0.5 mg PO bid x4 days; then 1 mg twice daily x11 wks  Dispense: 60 tablet; Refill: 1  3. Acute bronchitis, unspecified organism -Due to patient's history and current symptoms, will cover with antibiotic  - azithromycin (ZITHROMAX) 250 MG tablet; Take 2 tabs PO x 1 dose, then 1 tab PO QD x 4 days  Dispense: 6 tablet; Refill: 0 - albuterol (PROVENTIL HFA;VENTOLIN HFA) 108 (90 Base) MCG/ACT inhaler; Inhale 2 puffs into the lungs every 4 (four) hours as needed for wheezing or shortness of breath (cough, shortness of breath or wheezing.).  Dispense: 1 Inhaler; Refill: 0  4. Chronic obstructive pulmonary disease, unspecified COPD type ([place]) -Pt informed that CXR findings are consistent with underlying COPD. She would like referral to pulm for further evaluation.  - Ambulatory referral to Pulmonology   Tenna Delaine PA-C  [facility] 04/08/2016 11:27 AM

## 2016-04-08 NOTE — Patient Instructions (Addendum)
Smoking cessation Chantix instructions: Start: 0.5 mg (half tablet) by mouth daily x3 days, then 0.5 mg by mouth twice a day (one tablet)  x4 days; then 2 mg (1 tablet twice a day) every day x 11 weeks ; Info: give w/ food; start drug 1wk before quit date if quit date planned; stop smoking 8-35 days after starting drug if quit date unplanned; initial tx = 12wk (1 starting pack + 2 continuing packs); may cont. additional 12wk if initial tx successful    -Take antibiotics as prescribed. Use inhaler every 4-6 hours as needed. Take tessalon perles for cough up to three times a day.  - I recommend you rest, drink plenty of fluids, eat light meals including soups.  - You may also use Tylenol or ibuprofen over-the-counter for your sore throat.  - Please let me know if you are not seeing any improvement or get worse.      IF you received an x-ray today, you will receive an invoice from [facility]. Please contact [facility] at [phone] with questions or concerns regarding your invoice.   IF you received labwork today, you will receive an invoice from [employer]. Please contact [facility] at [phone] with questions or concerns regarding your invoice.   Our billing staff will not be able to assist you with questions regarding bills from these companies.  You will be contacted with the lab results as soon as they are available. The fastest way to get your results is to activate your My Chart account. Instructions are located on the last page of this paperwork. If you have not heard from [place] regarding the results in 2 weeks, please contact this office.

## 2016-04-09 ENCOUNTER — Encounter: Payer: Self-pay | Admitting: Physician Assistant

## 2016-04-11 ENCOUNTER — Telehealth: Payer: Self-pay

## 2016-04-11 NOTE — Telephone Encounter (Signed)
PATIENT STATES SHE WAS IN THE OFFICE TO SEE [facility] ON Friday FOR BRONCHITIS. SHE PRESCRIBED HER A Z-PACK WHICH IS NOT HELPING HER AT ALL. SHE HAS BEEN IN THE BED FOR 3 DAYS. SHE STILL FEELS RUN DOWN WITH BODY ACHES. SHE IS PRONE TO GET PNEUMONIA AND SHE FEELS LIKE SHE SHOULD BE DOING MUCH BETTER AFTER TAKING THE ANTIBIOTIC. SHE NEEDS TO GET SOMETHING STRONGER. SHE WAS GIVEN A NOTE TO RETURN BACK TO WORK ON Wednesday BUT SHE WILL NOT BE ABLE TO GO WITH THE WAY SHE FEELS NOW.  BEST PHONE [phone] (CELL)  [facility] [address]  [place]

## 2016-04-12 NOTE — Telephone Encounter (Signed)
Pt called again in regards to this.  She is calling out of work Architectural technologist.

## 2016-04-13 NOTE — Telephone Encounter (Signed)
Pt contacted. She has completed course of antibiotics and states that her body aches have resolved. Her cough has also gotten a lot better. She denies fever and chills. States she is just still fatigued. She was supposed to go back to work tomorrow for a 12 hour shift but would like to have until Monday off so she can adequately rest. I will give her a work note through [employer] to be off until Monday. Pt to contact office if she has any more questions.

## 2016-06-16 ENCOUNTER — Other Ambulatory Visit: Payer: Self-pay | Admitting: Family Medicine

## 2016-06-22 ENCOUNTER — Other Ambulatory Visit: Payer: Self-pay | Admitting: Family Medicine

## 2016-06-25 NOTE — Telephone Encounter (Signed)
03/2016 last ov 07/2015 last labs

## 2016-07-06 ENCOUNTER — Other Ambulatory Visit: Payer: Self-pay | Admitting: Family Medicine

## 2016-07-11 NOTE — Telephone Encounter (Signed)
Call --- provided with one month supply of Eliquis only.  Will be due for follow-up visit with me in upcoming month; last visit with me 07/2015.  Please schedule OV with me in January 2018.

## 2016-07-12 NOTE — Telephone Encounter (Signed)
NUMBER IS D/C

## 2016-08-05 ENCOUNTER — Other Ambulatory Visit: Payer: Self-pay | Admitting: Family Medicine

## 2016-08-29 ENCOUNTER — Other Ambulatory Visit: Payer: Self-pay | Admitting: Family Medicine

## 2017-03-03 ENCOUNTER — Ambulatory Visit: Payer: PRIVATE HEALTH INSURANCE | Admitting: Family Medicine

## 2017-03-08 ENCOUNTER — Ambulatory Visit (INDEPENDENT_AMBULATORY_CARE_PROVIDER_SITE_OTHER): Payer: PRIVATE HEALTH INSURANCE | Admitting: Family Medicine

## 2017-03-08 ENCOUNTER — Encounter: Payer: Self-pay | Admitting: Family Medicine

## 2017-03-08 ENCOUNTER — Ambulatory Visit (INDEPENDENT_AMBULATORY_CARE_PROVIDER_SITE_OTHER): Payer: PRIVATE HEALTH INSURANCE

## 2017-03-08 VITALS — BP 127/90 | HR 76 | Temp 97.6°F | Resp 18 | Ht 68.11 in | Wt 258.0 lb

## 2017-03-08 DIAGNOSIS — I493 Ventricular premature depolarization: Secondary | ICD-10-CM | POA: Diagnosis not present

## 2017-03-08 DIAGNOSIS — M25561 Pain in right knee: Secondary | ICD-10-CM

## 2017-03-08 DIAGNOSIS — I82221 Chronic embolism and thrombosis of inferior vena cava: Secondary | ICD-10-CM

## 2017-03-08 DIAGNOSIS — G4733 Obstructive sleep apnea (adult) (pediatric): Secondary | ICD-10-CM | POA: Diagnosis not present

## 2017-03-08 DIAGNOSIS — I10 Essential (primary) hypertension: Secondary | ICD-10-CM

## 2017-03-08 DIAGNOSIS — Z1159 Encounter for screening for other viral diseases: Secondary | ICD-10-CM | POA: Diagnosis not present

## 2017-03-08 DIAGNOSIS — E7800 Pure hypercholesterolemia, unspecified: Secondary | ICD-10-CM | POA: Diagnosis not present

## 2017-03-08 DIAGNOSIS — K219 Gastro-esophageal reflux disease without esophagitis: Secondary | ICD-10-CM

## 2017-03-08 DIAGNOSIS — Z8249 Family history of ischemic heart disease and other diseases of the circulatory system: Secondary | ICD-10-CM | POA: Diagnosis not present

## 2017-03-08 DIAGNOSIS — Z131 Encounter for screening for diabetes mellitus: Secondary | ICD-10-CM

## 2017-03-08 DIAGNOSIS — Z114 Encounter for screening for human immunodeficiency virus [HIV]: Secondary | ICD-10-CM

## 2017-03-08 DIAGNOSIS — Z720 Tobacco use: Secondary | ICD-10-CM | POA: Diagnosis not present

## 2017-03-08 LAB — POCT URINALYSIS DIP (MANUAL ENTRY)
Bilirubin, UA: NEGATIVE
GLUCOSE UA: NEGATIVE mg/dL
Leukocytes, UA: NEGATIVE
NITRITE UA: NEGATIVE
RBC UA: NEGATIVE
Spec Grav, UA: 1.015 (ref 1.010–1.025)
UROBILINOGEN UA: 0.2 U/dL
pH, UA: 7 (ref 5.0–8.0)

## 2017-03-08 MED ORDER — APIXABAN 5 MG PO TABS: 5.0000 mg | ORAL_TABLET | Freq: Two times a day (BID) | ORAL | 1 refills | Status: DC

## 2017-03-08 MED ORDER — ATORVASTATIN CALCIUM 10 MG PO TABS: 10.0000 mg | ORAL_TABLET | Freq: Every day | ORAL | 1 refills | Status: DC

## 2017-03-08 MED ORDER — METOPROLOL TARTRATE 25 MG PO TABS
ORAL_TABLET | ORAL | 1 refills | Status: DC
Start: 2017-03-08 — End: 2017-09-12

## 2017-03-08 MED ORDER — VARENICLINE TARTRATE 1 MG PO TABS
1.0000 mg | ORAL_TABLET | Freq: Two times a day (BID) | ORAL | 1 refills | Status: DC
Start: 2017-03-08 — End: 2017-03-25

## 2017-03-08 MED ORDER — PANTOPRAZOLE SODIUM 20 MG PO TBEC: 20.0000 mg | DELAYED_RELEASE_TABLET | Freq: Every day | ORAL | 1 refills | Status: DC

## 2017-03-08 MED ORDER — DICLOFENAC SODIUM 1 % TD GEL: 4.0000 g | Freq: Four times a day (QID) | TRANSDERMAL | 2 refills | Status: AC

## 2017-03-08 NOTE — Patient Instructions (Addendum)
IF you received an x-ray today, you will receive an invoice from [facility]. Please contact [facility] at [phone] with questions or concerns regarding your invoice.   IF you received labwork today, you will receive an invoice from [facility]. Please contact [facility] at [phone] with questions or concerns regarding your invoice.   Our billing staff will not be able to assist you with questions regarding bills from these companies.  You will be contacted with the lab results as soon as they are available. The fastest way to get your results is to activate your My Chart account. Instructions are located on the last page of this paperwork. If you have not heard from [place] regarding the results in 2 weeks, please contact this office.      Tobacco Use Disorder Tobacco use disorder (TUD) is a mental disorder. It is the long-term use of tobacco in spite of related health problems or difficulty with normal life activities. Tobacco is most commonly smoked as cigarettes and less commonly as cigars or pipes. Smokeless chewing tobacco and snuff are also popular. People with TUD get a feeling of extreme pleasure (euphoria) from using tobacco and have a desire to use it again and again. Repeated use of tobacco can cause problems. The addictive effects of tobacco are due mainly tothe ingredient nicotine. Nicotine also causes a rush of adrenaline (epinephrine) in the body. This leads to increased blood pressure, heart rate, and breathing rate. These changes may cause problems for people with high blood pressure, weak hearts, or lung disease. High doses of nicotine in children and pets can lead to seizures and death. Tobacco contains a number of other unsafe chemicals. These chemicals are especially harmful when inhaled as smoke and can damage almost every organ in the body. Smokers live shorter lives than nonsmokers and are at risk of dying from a number of diseases and cancers. Tobacco  smoke can also cause health problems for nonsmokers (due to inhaling secondhand smoke). Smoking is also a fire hazard. TUD usually starts in the late teenage years and is most common in young adults between the ages of 62 and 24 years. People who start smoking earlier in life are more likely to continue smoking as adults. TUD is somewhat more common in men than women. People with TUD are at higher risk for using alcohol and other drugs of abuse. What increases the risk? Risk factors for TUD include:  Having family members with the disorder.  Being around people who use tobacco.  Having an existing mental health issue such as schizophrenia, depression, bipolar disorder, ADHD, or posttraumatic stress disorder (PTSD).  What are the signs or symptoms? People with tobacco use disorder have two or more of the following signs and symptoms within 12 months:  Use of more tobacco over a longer period than intended.  Not able to cut down or control tobacco use.  A lot of time spent obtaining or using tobacco.  Strong desire or urge to use tobacco (craving). Cravings may last for 6 months or longer after quitting.  Use of tobacco even when use leads to major problems at work, school, or home.  Use of tobacco even when use leads to relationship problems.  Giving up or cutting down on important life activities because of tobacco use.  Repeatedly using tobacco in situations where it puts you or others in physical danger, like smoking in bed.  Use of tobacco even when it is known that a physical or mental  problem is likely related to tobacco use. ? Physical problems are numerous and may include chronic bronchitis, emphysema, lung and other cancers, gum disease, high blood pressure, heart disease, and stroke. ? Mental problems caused by tobacco may include difficulty sleeping and anxiety.  Need to use greater amounts of tobacco to get the same effect. This means you have developed a  tolerance.  Withdrawal symptoms as a result of stopping or rapidly cutting back use. These symptoms may last a month or more after quitting and include the following: ? Depressed, anxious, or irritable mood. ? Difficulty concentrating. ? Increased appetite. ? Restlessness or trouble sleeping. ? Use of tobacco to avoid withdrawal symptoms.  How is this diagnosed? Tobacco use disorder is diagnosed by your health care provider. A diagnosis may be made by:  Your health care provider asking questions about your tobacco use and any problems it may be causing.  A physical exam.  Lab tests.  You may be referred to a mental health professional or addiction specialist.  The severity of tobacco use disorder depends on the number of signs and symptoms you have:  Mild-Two or three symptoms.  Moderate-Four or five symptoms.  Severe-Six or more symptoms.  How is this treated? Many people with tobacco use disorder are unable to quit on their own and need help. Treatment options include the following:  Nicotine replacement therapy (NRT). NRT provides nicotine without the other harmful chemicals in tobacco. NRT gradually lowers the dosage of nicotine in the body and reduces withdrawal symptoms. NRT is available in over-the-counter forms (gum, lozenges, and skin patches) as well as prescription forms (mouth inhaler and nasal spray).  Medicines.This may include: ? Antidepressant medicine that may reduce nicotine cravings. ? A medicine that acts on nicotine receptors in the brain to reduce cravings and withdrawal symptoms. It may also block the effects of tobacco in people with TUD who relapse.  Counseling or talk therapy. A form of talk therapy called behavioral therapy is commonly used to treat people with TUD. Behavioral therapy looks at triggers for tobacco use, how to avoid them, and how to cope with cravings. It is most effective in person or by phone but is also available in self-help forms  (books and Internet websites).  Support groups. These provide emotional support, advice, and guidance for quitting tobacco.  The most effective treatment for TUD is usually a combination of medicine, talk therapy, and support groups. Follow these instructions at home:  Keep all follow-up visits as directed by your health care provider. This is important.  Take medicines only as directed by your health care provider.  Check with your health care provider before starting new prescription or over-the-counter medicines. Contact a health care provider if:  You are not able to take your medicines as prescribed.  Treatment is not helping your TUD and your symptoms get worse. Get help right away if:  You have serious thoughts about hurting yourself or others.  You have trouble breathing, chest pain, sudden weakness, or sudden numbness in part of your body. This information is not intended to replace advice given to you by your health care provider. Make sure you discuss any questions you have with your health care provider. Document Released: 03/16/2004 Document Revised: 03/13/2016 Document Reviewed: 09/06/2013 Elsevier Interactive Patient Education  [employer].

## 2017-03-08 NOTE — Progress Notes (Signed)
Subjective:    Patient ID: Leah Blevins, female    DOB: 1960/11/19, 56 y.o.   MRN: [id number]  03/08/2017  Hyperlipidemia (follow-up); Sleep Apnea; and Gastroesophageal Reflux   HPI This 56 y.o. female presents for follow-up of chronic medical conditions including hypertension, hypercholesterolemia, history of DVT, sleep apnea, GERD.    Last physical:  none Pap smear:  Hysterectomy; uterine prolapse Mammogram:  2017 Colonoscopy: 2017 Eye exam:  never Dental exam:  no   R knee pain: chronic.  Requesting meloxicam.  No recent imaging.  HTN: Patient reports good compliance with medication, good tolerance to medication, and good symptom control.    Hypercholesterolemia: Patient reports good compliance with medication, good tolerance to medication, and good symptom control.    OSA: needs new CPAP equipment.  Continues to smoke.  Requesting Chantix again; ready to quit.   BP Readings from Last 3 Encounters:  03/08/17 127/90  04/08/16 124/76  10/19/15 123/80   Wt Readings from Last 3 Encounters:  03/08/17 258 lb (117 kg)  04/08/16 269 lb (122 kg)  10/19/15 267 lb (121.1 kg)    There is no immunization history on file for this patient.  Review of Systems  Constitutional: Negative for chills, diaphoresis, fatigue and fever.  Eyes: Negative for visual disturbance.  Respiratory: Negative for cough and shortness of breath.   Cardiovascular: Negative for chest pain, palpitations and leg swelling.  Gastrointestinal: Negative for abdominal pain, constipation, diarrhea, nausea and vomiting.  Endocrine: Negative for cold intolerance, heat intolerance, polydipsia, polyphagia and polyuria.  Musculoskeletal: Positive for arthralgias and back pain.  Neurological: Negative for dizziness, tremors, seizures, syncope, facial asymmetry, speech difficulty, weakness, light-headedness, numbness and headaches.  Psychiatric/Behavioral: Negative for dysphoric mood. The patient is not  nervous/anxious.     Past Medical History:  Diagnosis Date  . DVT (deep venous thrombosis) ([place]) 02/2014  . GERD (gastroesophageal reflux disease)   . HLD (hyperlipidemia)   . Morbid obesity with BMI of 40.0-44.9, adult ([place])   . Pneumonia 02/2014  . PVC (premature ventricular contraction)   . Sleep apnea    Past Surgical History:  Procedure Laterality Date  . ABDOMINAL HYSTERECTOMY  2005   uterine fibroids/DUB.  ovaries removed.  . [address]  . RHINOPLASTY    . SEPTOPLASTY Bilateral    Allergies  Allergen Reactions  . Keflex [Cephalexin] Hives  . Morphine And Related Nausea And Vomiting  . Penicillins Hives and Itching   Current Outpatient Prescriptions  Medication Sig Dispense Refill  . apixaban (ELIQUIS) 5 MG TABS tablet Take 1 tablet (5 mg total) by mouth 2 (two) times daily. 180 tablet 1  . atorvastatin (LIPITOR) 10 MG tablet Take 1 tablet (10 mg total) by mouth daily. 90 tablet 1  . metoprolol tartrate (LOPRESSOR) 25 MG tablet TAKE 1/2 TABLET BY MOUTH 2 TIMES DAILY. 90 tablet 1  . pantoprazole (PROTONIX) 20 MG tablet Take 1 tablet (20 mg total) by mouth daily. 90 tablet 1  . varenicline (CHANTIX) 1 MG tablet Take 1 tablet (1 mg total) by mouth 2 (two) times daily. 0.5 mg PO qd x3 days, then 0.5 mg PO bid x4 days; then 1 mg twice daily x11 wks 60 tablet 1  . diclofenac sodium (VOLTAREN) 1 % GEL Apply 4 g topically 4 (four) times daily. 100 g 2   No current facility-administered medications for this visit.    Social History   Social History  . Marital status: Married    Spouse name:  N/A  . Number of children: 1  . Years of education: N/A   Occupational History  . workamper    Social History Main Topics  . Smoking status: Former Smoker    Packs/day: 0.50    Years: 40.00    Types: Cigarettes    Quit date: 09/29/2015  . Smokeless tobacco: Never Used     Comment: vapor sig  . Alcohol use 0.0 oz/week     Comment: sometimes- beer  . Drug use: No  .  Sexual activity: Yes   Other Topics Concern  . Not on file   Social History Narrative   Marital status:  Married      Children: 42 year old son; no grandchildren.      Lives: with husband; travels in [place]; home is [place].       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      Tobacco: 1 ppd x 23 years      Alcohol: rarely; beers      Exercise:  None         Family History  Problem Relation Age of Onset  . Diabetes Mother   . Heart disease Mother 76       CABG/CAD  . Hyperlipidemia Mother   . Hypertension Mother   . Colon polyps Mother   . Clotting disorder Mother   . Deep vein thrombosis Mother   . Cancer Father        vocal cords  . Heart disease Father 67       CABG  . Hyperlipidemia Father   . Hypertension Father   . Colon polyps Father   . Cancer Brother        vocal cords  . Heart disease Brother 40       CABG/CAD/stents  . Heart disease Sister        AMI but no stenting; atrial fibrillation  . Colon cancer Maternal Aunt   . Heart disease Brother        Atrial  fibrillation       Objective:    BP 127/90   Pulse 76   Temp 97.6 F (36.4 C) (Oral)   Resp 18   Ht 5' 8.11" (1.73 m)   Wt 258 lb (117 kg)   SpO2 98%   BMI 39.10 kg/m  Physical Exam  Constitutional: She is oriented to person, place, and time. She appears well-developed and well-nourished. No distress.  HENT:  Head: Normocephalic and atraumatic.  Right Ear: External ear normal.  Left Ear: External ear normal.  Nose: Nose normal.  Mouth/Throat: Oropharynx is clear and moist.  Eyes: Pupils are equal, round, and reactive to light. Conjunctivae and EOM are normal.  Neck: Normal range of motion. Neck supple. Carotid bruit is not present. No thyromegaly present.  Cardiovascular: Normal rate, regular rhythm, normal heart sounds and intact distal pulses.  Exam reveals no gallop and no friction rub.   No murmur heard. Pulmonary/Chest: Effort normal  and breath sounds normal. She has no wheezes. She has no rales.  Abdominal: Soft. Bowel sounds are normal. She exhibits no distension and no mass. There is no tenderness. There is no rebound and no guarding.  Musculoskeletal:       Right knee: She exhibits normal range of motion, no swelling and no effusion. Tenderness found. No patellar tendon tenderness noted.       Left knee: She exhibits normal range  of motion, no swelling and no effusion. Tenderness found. No patellar tendon tenderness noted.  Lymphadenopathy:    She has no cervical adenopathy.  Neurological: She is alert and oriented to person, place, and time. No cranial nerve deficit.  Skin: Skin is warm and dry. No rash noted. She is not diaphoretic. No erythema. No pallor.  Psychiatric: She has a normal mood and affect. Her behavior is normal.    No results found. Depression screen [facility] 2/9 03/08/2017 04/08/2016 08/06/2015  Decreased Interest 0 0 0  Down, Depressed, Hopeless 0 0 0  PHQ - 2 Score 0 0 0   Fall Risk  03/08/2017 04/08/2016 08/06/2015  Falls in the past year? No No No        Assessment & Plan:   1. 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 PVC (premature ventricular contraction)   3. Gastroesophageal reflux disease without esophagitis   4. Family history of cardiovascular disease   5. Tobacco abuse   6. OSA (obstructive sleep apnea)   7. Need for hepatitis C screening test   8. Encounter for screening for HIV   9. Essential hypertension   10. Pure hypercholesterolemia   11. Screening for diabetes mellitus   12. Recurrent pain of right knee    -rx for new CPAP equipment provided.  -obtain R knee films for chronic knee pain; rx for Diclofenac gel. -obtain labs for chronic disease management and age appropriate screenings. -refills provided on chronic medications. -smoking cessation counseling proivded for 3 minutes; refill of Chantix provided.   Orders Placed This Encounter    Procedures  . For home use only DME continuous positive airway pressure (CPAP)    8 cm pressure    Order Specific Question:   Patient has OSA or probable OSA    Answer:   Yes    Order Specific Question:   Is the patient currently using CPAP in the home    Answer:   Yes    Order Specific Question:   Settings    Answer:   5-10    Order Specific Question:   CPAP supplies needed    Answer:   Mask, headgear, cushions, filters, heated tubing and water chamber  . DG Knee Complete 4 Views Right    Standing Status:   Future    Number of Occurrences:   1    Standing Expiration Date:   03/08/2018    Order Specific Question:   Reason for Exam (SYMPTOM  OR DIAGNOSIS REQUIRED)    Answer:   R lateral knee pain; swelling    Order Specific Question:   Is the patient pregnant?    Answer:   No    Order Specific Question:   Preferred imaging location?    Answer:   External  . CBC with Differential/Platelet  . Comprehensive metabolic panel    Order Specific Question:   Has the patient fasted?    Answer:   Yes  . Lipid panel    Order Specific Question:   Has the patient fasted?    Answer:   Yes  . TSH  . Hemoglobin A1c  . HIV antibody  . Hepatitis C antibody  . POCT urinalysis dipstick  . EKG 12-Lead   Meds ordered this encounter  Medications  . atorvastatin (LIPITOR) 10 MG tablet    Sig: Take 1 tablet (10 mg total) by mouth daily.    Dispense:  90 tablet    Refill:  1  . apixaban (ELIQUIS) 5 MG TABS tablet    Sig: Take 1 tablet (5 mg total) by mouth 2 (two) times daily.    Dispense:  180 tablet    Refill:  1  . metoprolol tartrate (LOPRESSOR) 25 MG tablet    Sig: TAKE 1/2 TABLET BY MOUTH 2 TIMES DAILY.    Dispense:  90 tablet    Refill:  1  . pantoprazole (PROTONIX) 20 MG tablet    Sig: Take 1 tablet (20 mg total) by mouth daily.    Dispense:  90 tablet    Refill:  1  . varenicline (CHANTIX) 1 MG tablet    Sig: Take 1 tablet (1 mg total) by mouth 2 (two) times daily. 0.5 mg PO qd x3  days, then 0.5 mg PO bid x4 days; then 1 mg twice daily x11 wks    Dispense:  60 tablet    Refill:  1  . diclofenac sodium (VOLTAREN) 1 % GEL    Sig: Apply 4 g topically 4 (four) times daily.    Dispense:  100 g    Refill:  2    Return in about 6 months (around 09/08/2017) for complete physical examiniation.   Evalene Vath Elayne Guerin, M.D. Primary Care at [facility] previously [facility] [place] [address] [place], [place]  [address] [phone] phone [phone] fax .

## 2017-03-09 LAB — CBC WITH DIFFERENTIAL/PLATELET
BASOS ABS: 0.1 10*3/uL (ref 0.0–0.2)
BASOS: 1 %
EOS (ABSOLUTE): 0.2 10*3/uL (ref 0.0–0.4)
Eos: 3 %
HEMOGLOBIN: 14.5 g/dL (ref 11.1–15.9)
Hematocrit: 43.8 % (ref 34.0–46.6)
Immature Grans (Abs): 0 10*3/uL (ref 0.0–0.1)
Immature Granulocytes: 1 %
LYMPHS ABS: 2.4 10*3/uL (ref 0.7–3.1)
Lymphs: 39 %
MCH: 32.2 pg (ref 26.6–33.0)
MCHC: 33.1 g/dL (ref 31.5–35.7)
MCV: 97 fL (ref 79–97)
Monocytes Absolute: 0.5 10*3/uL (ref 0.1–0.9)
Monocytes: 8 %
NEUTROS ABS: 3.1 10*3/uL (ref 1.4–7.0)
Neutrophils: 48 %
PLATELETS: 281 10*3/uL (ref 150–379)
RBC: 4.5 x10E6/uL (ref 3.77–5.28)
RDW: 13.2 % (ref 12.3–15.4)
WBC: 6.3 10*3/uL (ref 3.4–10.8)

## 2017-03-09 LAB — LIPID PANEL
Chol/HDL Ratio: 6 ratio — ABNORMAL HIGH (ref 0.0–4.4)
Cholesterol, Total: 271 mg/dL — ABNORMAL HIGH (ref 100–199)
HDL: 45 mg/dL (ref >39)
LDL CALC: 188 mg/dL — AB (ref 0–99)
TRIGLYCERIDES: 188 mg/dL — AB (ref 0–149)
VLDL Cholesterol Cal: 38 mg/dL (ref 5–40)

## 2017-03-09 LAB — COMPREHENSIVE METABOLIC PANEL
A/G RATIO: 1.6 (ref 1.2–2.2)
ALBUMIN: 4.4 g/dL (ref 3.5–5.5)
ALT: 12 IU/L (ref 0–32)
AST: 19 IU/L (ref 0–40)
Alkaline Phosphatase: 68 IU/L (ref 39–117)
BILIRUBIN TOTAL: 0.3 mg/dL (ref 0.0–1.2)
BUN / CREAT RATIO: 14 (ref 9–23)
BUN: 10 mg/dL (ref 6–24)
CHLORIDE: 106 mmol/L (ref 96–106)
CO2: 22 mmol/L (ref 20–29)
Calcium: 9.8 mg/dL (ref 8.7–10.2)
Creatinine, Ser: 0.71 mg/dL (ref 0.57–1.00)
GFR calc Af Amer: 110 mL/min/{1.73_m2} (ref >59)
GFR calc non Af Amer: 96 mL/min/{1.73_m2} (ref >59)
GLUCOSE: 86 mg/dL (ref 65–99)
Globulin, Total: 2.7 g/dL (ref 1.5–4.5)
Potassium: 4.3 mmol/L (ref 3.5–5.2)
Sodium: 142 mmol/L (ref 134–144)
TOTAL PROTEIN: 7.1 g/dL (ref 6.0–8.5)

## 2017-03-09 LAB — TSH: TSH: 2.57 u[IU]/mL (ref 0.450–4.500)

## 2017-03-09 LAB — HEMOGLOBIN A1C
ESTIMATED AVERAGE GLUCOSE: 103 mg/dL
Hgb A1c MFr Bld: 5.2 % (ref 4.8–5.6)

## 2017-03-09 LAB — HIV ANTIBODY (ROUTINE TESTING W REFLEX): HIV SCREEN 4TH GENERATION: NONREACTIVE

## 2017-03-09 LAB — HEPATITIS C ANTIBODY: Hep C Virus Ab: <0.1 s/co ratio (ref 0.0–0.9)

## 2017-03-13 ENCOUNTER — Telehealth: Payer: Self-pay | Admitting: Family Medicine

## 2017-03-13 DIAGNOSIS — Z9989 Dependence on other enabling machines and devices: Principal | ICD-10-CM

## 2017-03-13 DIAGNOSIS — Z720 Tobacco use: Secondary | ICD-10-CM

## 2017-03-13 DIAGNOSIS — G4733 Obstructive sleep apnea (adult) (pediatric): Secondary | ICD-10-CM

## 2017-03-13 NOTE — Telephone Encounter (Signed)
PT CALLING STATING THAT DR Tamala Julian WAS MAKING A REFERRAL FOR HER TO GET SUPPLIES FOR CPAP MACHINE TO CHECK HER LABS SHE WAS SUPPOSE TO HAVE GOTTEN A TDAP WHEN SHE WAS HERE AT HER OV AND [facility] HAS CALLED STATING THAT THEY CANT FILL THE CHANTIX WITH OUT KNOWING HOW [facility] [place]

## 2017-03-16 ENCOUNTER — Telehealth: Payer: Self-pay | Admitting: *Deleted

## 2017-03-16 NOTE — Telephone Encounter (Signed)
Per pharmacist at [place], [place]. [phone], order for Chantix written incorrectly, if it is a starter pack, it should be written 1 box for 1 monthw/  0 refill. If pt is going to continue, then it can be sent as Chantix continuing pack w/refill. Pt did not get Tdap at last visit and she would like for a future order to be put in.

## 2017-03-22 ENCOUNTER — Encounter: Payer: Self-pay | Admitting: Family Medicine

## 2017-03-25 MED ORDER — TRIAMCINOLONE 0.1 % CREAM:EUCERIN CREAM 1:1: 1.0000 "application " | TOPICAL_CREAM | Freq: Two times a day (BID) | CUTANEOUS | 2 refills | Status: DC | PRN

## 2017-03-25 MED ORDER — TRIAMCINOLONE ACETONIDE 0.1 % EX CREA
1.0000 | TOPICAL_CREAM | Freq: Two times a day (BID) | CUTANEOUS | 1 refills | Status: AC
Start: 2017-03-25 — End: ?

## 2017-03-25 MED ORDER — VARENICLINE TARTRATE 1 MG PO TABS: 1.0000 mg | ORAL_TABLET | Freq: Two times a day (BID) | ORAL | 3 refills | Status: AC

## 2017-03-25 NOTE — Telephone Encounter (Signed)
Please contact [facility] to coordinate having patient receive appropriate CPAP supplies. Order is on chart.  Sent in repeat rx for Chantix to pharmacy.  Can receive TDAP at next office visit.

## 2017-03-25 NOTE — Addendum Note (Signed)
Addended by: Wardell Honour on: 03/25/2017 01:35 PM   Modules accepted: Orders

## 2017-03-25 NOTE — Telephone Encounter (Signed)
Duplicate message.  Addressed.

## 2017-03-29 NOTE — Telephone Encounter (Signed)
Order received on 8/29 per [facility] at [place] order is in process.

## 2017-04-01 ENCOUNTER — Ambulatory Visit (INDEPENDENT_AMBULATORY_CARE_PROVIDER_SITE_OTHER): Payer: PRIVATE HEALTH INSURANCE | Admitting: Family Medicine

## 2017-04-01 ENCOUNTER — Encounter: Payer: Self-pay | Admitting: Family Medicine

## 2017-04-01 VITALS — BP 104/71 | HR 76 | Temp 97.9°F | Resp 16 | Ht 66.75 in | Wt 257.2 lb

## 2017-04-01 DIAGNOSIS — G4733 Obstructive sleep apnea (adult) (pediatric): Secondary | ICD-10-CM | POA: Diagnosis not present

## 2017-04-01 DIAGNOSIS — Z23 Encounter for immunization: Secondary | ICD-10-CM | POA: Diagnosis not present

## 2017-04-01 DIAGNOSIS — R300 Dysuria: Secondary | ICD-10-CM | POA: Diagnosis not present

## 2017-04-01 DIAGNOSIS — N3000 Acute cystitis without hematuria: Secondary | ICD-10-CM

## 2017-04-01 LAB — POCT URINALYSIS DIP (MANUAL ENTRY)
BILIRUBIN UA: NEGATIVE mg/dL
Bilirubin, UA: NEGATIVE
Blood, UA: NEGATIVE
Glucose, UA: NEGATIVE mg/dL
Leukocytes, UA: NEGATIVE
Nitrite, UA: NEGATIVE
Protein Ur, POC: NEGATIVE mg/dL
SPEC GRAV UA: 1.01 (ref 1.010–1.025)
UROBILINOGEN UA: 0.2 U/dL
pH, UA: 7.5 (ref 5.0–8.0)

## 2017-04-01 LAB — POC MICROSCOPIC URINALYSIS (UMFC): MUCUS RE: ABSENT

## 2017-04-01 LAB — POCT WET + KOH PREP
Trich by wet prep: ABSENT
YEAST BY KOH: ABSENT
Yeast by wet prep: ABSENT

## 2017-04-01 MED ORDER — NITROFURANTOIN MONOHYD MACRO 100 MG PO CAPS: 100.0000 mg | ORAL_CAPSULE | Freq: Two times a day (BID) | ORAL | 0 refills | Status: AC

## 2017-04-01 NOTE — Progress Notes (Addendum)
Subjective:  By signing my name below, I, Essence Howell, attest that this documentation has been prepared under the direction and in the presence of Delman Cheadle, MD Electronically Signed: Ladene Artist, ED Scribe 04/01/2017 at 12:11 PM.   Patient ID: Leah Blevins, female    DOB: [birth year], 56 y.o.   MRN: [id number]  Chief Complaint  Patient presents with  . Dysuria   HPI Leah Blevins is a 56 y.o. female who presents to Primary Care at [facility] complaining of mild dysuria onset 6 days. Pt has noticed that she has been waking at least 1-2 more times during the night to urinate, urinary frequency during the day since last week, dark colored urine and incomplete emptying. Pt also reports mild pressure with urinating that she states is "similar to straining". Also reports intermittent diarrhea that she attributes to increased stress from putting her 60 y.o cats down. Pt has tried Monistat without any improvement and Azo with mild relief. Also reports drinking 2 bottles of water just PTA. Denies vaginal discharge, vaginal bleeding, malodor, difficulty urinating, nausea, vomiting, fever, chills, flank pain.   Cpap Pt notices excellent benefit with cpap. Would like to resume. She has machine but no supplies. She would like to have supplies so she can resume compliance.   Past Medical History:  Diagnosis Date  . DVT (deep venous thrombosis) ([place]) 02/2014  . GERD (gastroesophageal reflux disease)   . HLD (hyperlipidemia)   . Morbid obesity with BMI of 40.0-44.9, adult ([place])   . Pneumonia 02/2014  . PVC (premature ventricular contraction)   . Sleep apnea    Current Outpatient Prescriptions on File Prior to Visit  Medication Sig Dispense Refill  . apixaban (ELIQUIS) 5 MG TABS tablet Take 1 tablet (5 mg total) by mouth 2 (two) times daily. 180 tablet 1  . atorvastatin (LIPITOR) 10 MG tablet Take 1 tablet (10 mg total) by mouth daily. 90 tablet 1  . diclofenac sodium (VOLTAREN) 1 % GEL Apply 4 g topically 4  (four) times daily. 100 g 2  . metoprolol tartrate (LOPRESSOR) 25 MG tablet TAKE 1/2 TABLET BY MOUTH 2 TIMES DAILY. 90 tablet 1  . pantoprazole (PROTONIX) 20 MG tablet Take 1 tablet (20 mg total) by mouth daily. 90 tablet 1  . Triamcinolone Acetonide (TRIAMCINOLONE 0.1 % CREAM : EUCERIN) CREA Apply 1 application topically 2 (two) times daily as needed. 1:1 ratio 1 each 2  . triamcinolone cream (KENALOG) 0.1 % Apply 1 application topically 2 (two) times daily. 453 g 1  . varenicline (CHANTIX) 1 MG tablet Take 1 tablet (1 mg total) by mouth 2 (two) times daily. 60 tablet 3   No current facility-administered medications on file prior to visit.    Allergies  Allergen Reactions  . Keflex [Cephalexin] Hives  . Morphine And Related Nausea And Vomiting  . Penicillins Hives and Itching    Past Surgical History:  Procedure Laterality Date  . ABDOMINAL HYSTERECTOMY  2005   uterine fibroids/DUB.  ovaries removed.  . [place]  . RHINOPLASTY    . SEPTOPLASTY Bilateral    Family History  Problem Relation Age of Onset  . Diabetes Mother   . Heart disease Mother 75       CABG/CAD  . Hyperlipidemia Mother   . Hypertension Mother   . Colon polyps Mother   . Clotting disorder Mother   . Deep vein thrombosis Mother   . Cancer Father        vocal  cords  . Heart disease Father 60       CABG  . Hyperlipidemia Father   . Hypertension Father   . Colon polyps Father   . Cancer Brother        vocal cords  . Heart disease Brother 40       CABG/CAD/stents  . Heart disease Sister        AMI but no stenting; atrial fibrillation  . Colon cancer Maternal Aunt   . Heart disease Brother        Atrial  fibrillation   Social History   Social History  . Marital status: Married    Spouse name: N/A  . Number of children: 1  . Years of education: N/A   Occupational History  . workamper    Social History Main Topics  . Smoking status: Current Some Day Smoker    Packs/day: 0.50     Years: 40.00    Types: Cigarettes    Last attempt to quit: 09/29/2015  . Smokeless tobacco: Never Used     Comment: vapor sig  . Alcohol use 0.0 oz/week     Comment: sometimes- beer  . Drug use: No  . Sexual activity: Yes   Other Topics Concern  . None   Social History Narrative   Marital status:  Married      Children: 52 year old son; no grandchildren.      Lives: with husband; travels in [place]; home is [place].       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      Tobacco: 1 ppd x 23 years      Alcohol: rarely; beers      Exercise:  None         Depression screen [facility] 2/9 04/01/2017 03/08/2017 04/08/2016 08/06/2015  Decreased Interest 0 0 0 0  Down, Depressed, Hopeless 0 0 0 0  PHQ - 2 Score 0 0 0 0    Review of Systems  Constitutional: Negative for chills and fever.  Gastrointestinal: Positive for diarrhea. Negative for nausea and vomiting.  Genitourinary: Positive for dysuria and frequency. Negative for difficulty urinating, flank pain, vaginal bleeding and vaginal discharge.      Objective:   Physical Exam  Constitutional: She is oriented to person, place, and time. She appears well-developed and well-nourished. No distress.  HENT:  Head: Normocephalic and atraumatic.  Eyes: Conjunctivae and EOM are normal.  Neck: Neck supple. No tracheal deviation present.  Cardiovascular: Normal rate.   Pulmonary/Chest: Effort normal. No respiratory distress.  Genitourinary:  Genitourinary Comments: Chaperone present. Slight erythema on the labia and vaginal introitus which was not irritated and nontender, not itching. Normal vaginal vault. Cervix surgically absent. No tenderness or masses on bimanual exam.   Musculoskeletal: Normal range of motion.  Neurological: She is alert and oriented to person, place, and time.  Skin: Skin is warm and dry.  Psychiatric: She has a normal mood and affect. Her behavior is normal.  Nursing note and  vitals reviewed.  BP 104/71   Pulse 76   Temp 97.9 F (36.6 C) (Oral)   Resp 16   Ht 5' 6.75" (1.695 m)   Wt 257 lb 3.2 oz (116.7 kg)   SpO2 98%   BMI 40.59 kg/m     Results for orders placed or performed in visit on 04/01/17  POCT urinalysis dipstick  Result Value Ref Range   Color, UA  yellow yellow   Clarity, UA clear clear   Glucose, UA negative negative mg/dL   Bilirubin, UA negative negative   Ketones, POC UA negative negative mg/dL   Spec Grav, UA 1.010 1.010 - 1.025   Blood, UA negative negative   pH, UA 7.5 5.0 - 8.0   Protein Ur, POC negative negative mg/dL   Urobilinogen, UA 0.2 0.2 or 1.0 E.U./dL   Nitrite, UA Negative Negative   Leukocytes, UA Negative Negative  POCT Microscopic Urinalysis ([facility])  Result Value Ref Range   WBC,UR,HPF,POC None None WBC/hpf   RBC,UR,HPF,POC None None RBC/hpf   Bacteria None None, Too numerous to count   Mucus Absent Absent   Epithelial Cells, UR Per Microscopy Few (A) None, Too numerous to count cells/hpf   Assessment & Plan:   1. Dysuria   2. Acute cystitis without hematuria - urine normal though sxs very consistent so will give pt snap rx in case sxs worsen while UClx P.  3. OSA (obstructive sleep apnea) - needs new accessories  4. Need for tetanus booster   5. Need for prophylactic vaccination and inoculation against influenza     Orders Placed This Encounter  Procedures  . For home use only DME continuous positive airway pressure (CPAP)    Pt has machine - just needs supplies. Does not have a local DME company/supplier - received her sleep study and machine in [place]. Uses 8 cm H2O pressure    Order Specific Question:   Patient has OSA or probable OSA    Answer:   Yes    Order Specific Question:   Is the patient currently using CPAP in the home    Answer:   Yes    Order Specific Question:   If yes (to question two)    Answer:   Determine DME provider and inform them of any new orders/settings    Order Specific  Question:   Date of face to face encounter    Answer:   04/01/2017    Order Specific Question:   Settings    Answer:   5-10    Order Specific Question:   Signs and symptoms of probable OSA  (select all that apply)    Answer:   Witnessed apneas    Order Specific Question:   CPAP supplies needed    Answer:   Mask, headgear, cushions, filters, heated tubing and water chamber  . Urine Culture  . Tdap vaccine greater than or equal to 7yo IM  . Flu Vaccine QUAD 36+ mos IM  . POCT urinalysis dipstick  . POCT Microscopic Urinalysis ([facility])  . POCT Wet + KOH Prep    Meds ordered this encounter  Medications  . nitrofurantoin, macrocrystal-monohydrate, (MACROBID) 100 MG capsule    Sig: Take 1 capsule (100 mg total) by mouth 2 (two) times daily.    Dispense:  14 capsule    Refill:  0    I personally performed the services described in this documentation, which was scribed in my presence. The recorded information has been reviewed and considered, and addended by me as needed.   Delman Cheadle, M.D.  Primary Care at [facility] [address] [place], [place] [address] [phone] phone [phone] fax  04/03/17 11:52 PM

## 2017-04-01 NOTE — Patient Instructions (Addendum)
   IF you received an x-ray today, you will receive an invoice from [facility]. Please contact  Radiology at [phone] with questions or concerns regarding your invoice.   IF you received labwork today, you will receive an invoice from [facility]. Please contact [facility] at [phone] with questions or concerns regarding your invoice.   Our billing staff will not be able to assist you with questions regarding bills from these companies.  You will be contacted with the lab results as soon as they are available. The fastest way to get your results is to activate your My Chart account. Instructions are located on the last page of this paperwork. If you have not heard from us regarding the results in 2 weeks, please contact this office.     Urinary Tract Infection, Adult A urinary tract infection (UTI) is an infection of any part of the urinary tract, which includes the kidneys, ureters, bladder, and urethra. These organs make, store, and get rid of urine in the body. UTI can be a bladder infection (cystitis) or kidney infection (pyelonephritis). What are the causes? This infection may be caused by fungi, viruses, or bacteria. Bacteria are the most common cause of UTIs. This condition can also be caused by repeated incomplete emptying of the bladder during urination. What increases the risk? This condition is more likely to develop if:  You ignore your need to urinate or hold urine for long periods of time.  You do not empty your bladder completely during urination.  You wipe back to front after urinating or having a bowel movement, if you are female.  You are uncircumcised, if you are female.  You are constipated.  You have a urinary catheter that stays in place (indwelling).  You have a weak defense (immune) system.  You have a medical condition that affects your bowels, kidneys, or bladder.  You have diabetes.  You take antibiotic medicines frequently or  for long periods of time, and the antibiotics no longer work well against certain types of infections (antibiotic resistance).  You take medicines that irritate your urinary tract.  You are exposed to chemicals that irritate your urinary tract.  You are female. What are the signs or symptoms? Symptoms of this condition include:  Fever.  Frequent urination or passing small amounts of urine frequently.  Needing to urinate urgently.  Pain or burning with urination.  Urine that smells bad or unusual.  Cloudy urine.  Pain in the lower abdomen or back.  Trouble urinating.  Blood in the urine.  Vomiting or being less hungry than normal.  Diarrhea or abdominal pain.  Vaginal discharge, if you are female. How is this diagnosed? This condition is diagnosed with a medical history and physical exam. You will also need to provide a urine sample to test your urine. Other tests may be done, including:  Blood tests.  Sexually transmitted disease (STD) testing. If you have had more than one UTI, a cystoscopy or imaging studies may be done to determine the cause of the infections. How is this treated? Treatment for this condition often includes a combination of two or more of the following:  Antibiotic medicine.  Other medicines to treat less common causes of UTI.  Over-the-counter medicines to treat pain.  Drinking enough water to stay hydrated. Follow these instructions at home:  Take over-the-counter and prescription medicines only as told by your health care provider.  If you were prescribed an antibiotic, take it as told by your health care   provider. Do not stop taking the antibiotic even if you start to feel better.  Avoid alcohol, caffeine, tea, and carbonated beverages. They can irritate your bladder.  Drink enough fluid to keep your urine clear or pale yellow.  Keep all follow-up visits as told by your health care provider. This is important.  Make sure  to:  Empty your bladder often and completely. Do not hold urine for long periods of time.  Empty your bladder before and after sex.  Wipe from front to back after a bowel movement if you are female. Use each tissue one time when you wipe. Contact a health care provider if:  You have back pain.  You have a fever.  You feel nauseous or vomit.  Your symptoms do not get better after 3 days.  Your symptoms go away and then return. Get help right away if:  You have severe back pain or lower abdominal pain.  You are vomiting and cannot keep down any medicines or water. This information is not intended to replace advice given to you by your health care provider. Make sure you discuss any questions you have with your health care provider. Document Released: 04/20/2005 Document Revised: 12/23/2015 Document Reviewed: 06/01/2015 Elsevier Interactive Patient Education  2017 Elsevier Inc.  

## 2017-04-02 LAB — URINE CULTURE

## 2017-05-08 ENCOUNTER — Ambulatory Visit (INDEPENDENT_AMBULATORY_CARE_PROVIDER_SITE_OTHER): Payer: PRIVATE HEALTH INSURANCE | Admitting: Family Medicine

## 2017-05-08 ENCOUNTER — Encounter: Payer: Self-pay | Admitting: Family Medicine

## 2017-05-08 VITALS — BP 118/66 | HR 82 | Temp 98.0°F | Resp 16 | Ht 68.11 in | Wt 253.0 lb

## 2017-05-08 DIAGNOSIS — Z720 Tobacco use: Secondary | ICD-10-CM

## 2017-05-08 DIAGNOSIS — R5081 Fever presenting with conditions classified elsewhere: Secondary | ICD-10-CM | POA: Diagnosis not present

## 2017-05-08 DIAGNOSIS — K0889 Other specified disorders of teeth and supporting structures: Secondary | ICD-10-CM | POA: Diagnosis not present

## 2017-05-08 DIAGNOSIS — J069 Acute upper respiratory infection, unspecified: Secondary | ICD-10-CM | POA: Diagnosis not present

## 2017-05-08 DIAGNOSIS — R110 Nausea: Secondary | ICD-10-CM | POA: Diagnosis not present

## 2017-05-08 LAB — POC INFLUENZA A&B (BINAX/QUICKVUE)
INFLUENZA B, POC: NEGATIVE
Influenza A, POC: NEGATIVE

## 2017-05-08 MED ORDER — TRAMADOL HCL 50 MG PO TABS: 50.0000 mg | ORAL_TABLET | Freq: Three times a day (TID) | ORAL | 0 refills | Status: AC | PRN

## 2017-05-08 MED ORDER — DOXYCYCLINE HYCLATE 100 MG PO CAPS: 100.0000 mg | ORAL_CAPSULE | Freq: Two times a day (BID) | ORAL | 0 refills | Status: AC

## 2017-05-08 MED ORDER — IPRATROPIUM BROMIDE 0.03 % NA SOLN: 2.0000 | Freq: Two times a day (BID) | NASAL | 1 refills | Status: AC

## 2017-05-08 NOTE — Patient Instructions (Addendum)
   IF you received an x-ray today, you will receive an invoice from [facility]. Please contact [facility] at [phone] with questions or concerns regarding your invoice.   IF you received labwork today, you will receive an invoice from [facility]. Please contact [facility] at [phone] with questions or concerns regarding your invoice.   Our billing staff will not be able to assist you with questions regarding bills from these companies.  You will be contacted with the lab results as soon as they are available. The fastest way to get your results is to activate your My Chart account. Instructions are located on the last page of this paperwork. If you have not heard from us regarding the results in 2 weeks, please contact this office.      Upper Respiratory Infection, Adult Most upper respiratory infections (URIs) are a viral infection of the air passages leading to the lungs. A URI affects the nose, throat, and upper air passages. The most common type of URI is nasopharyngitis and is typically referred to as "the common cold." URIs run their course and usually go away on their own. Most of the time, a URI does not require medical attention, but sometimes a bacterial infection in the upper airways can follow a viral infection. This is called a secondary infection. Sinus and middle ear infections are common types of secondary upper respiratory infections. Bacterial pneumonia can also complicate a URI. A URI can worsen asthma and chronic obstructive pulmonary disease (COPD). Sometimes, these complications can require emergency medical care and may be life threatening. What are the causes? Almost all URIs are caused by viruses. A virus is a type of germ and can spread from one person to another. What increases the risk? You may be at risk for a URI if:  You smoke.  You have chronic heart or lung disease.  You have a weakened defense (immune) system.  You are very  young or very old.  You have nasal allergies or asthma.  You work in crowded or poorly ventilated areas.  You work in health care facilities or schools. What are the signs or symptoms? Symptoms typically develop 2-3 days after you come in contact with a cold virus. Most viral URIs last 7-10 days. However, viral URIs from the influenza virus (flu virus) can last 14-18 days and are typically more severe. Symptoms may include:  Runny or stuffy (congested) nose.  Sneezing.  Cough.  Sore throat.  Headache.  Fatigue.  Fever.  Loss of appetite.  Pain in your forehead, behind your eyes, and over your cheekbones (sinus pain).  Muscle aches. How is this diagnosed? Your health care provider may diagnose a URI by:  Physical exam.  Tests to check that your symptoms are not due to another condition such as:  Strep throat.  Sinusitis.  Pneumonia.  Asthma. How is this treated? A URI goes away on its own with time. It cannot be cured with medicines, but medicines may be prescribed or recommended to relieve symptoms. Medicines may help:  Reduce your fever.  Reduce your cough.  Relieve nasal congestion. Follow these instructions at home:  Take medicines only as directed by your health care provider.  Gargle warm saltwater or take cough drops to comfort your throat as directed by your health care provider.  Use a warm mist humidifier or inhale steam from a shower to increase air moisture. This may make it easier to breathe.  Drink enough fluid to keep your urine clear   or pale yellow.  Eat soups and other clear broths and maintain good nutrition.  Rest as needed.  Return to work when your temperature has returned to normal or as your health care provider advises. You may need to stay home longer to avoid infecting others. You can also use a face mask and careful hand washing to prevent spread of the virus.  Increase the usage of your inhaler if you have asthma.  Do not  use any tobacco products, including cigarettes, chewing tobacco, or electronic cigarettes. If you need help quitting, ask your health care provider. How is this prevented? The best way to protect yourself from getting a cold is to practice good hygiene.  Avoid oral or hand contact with people with cold symptoms.  Wash your hands often if contact occurs. There is no clear evidence that vitamin C, vitamin E, echinacea, or exercise reduces the chance of developing a cold. However, it is always recommended to get plenty of rest, exercise, and practice good nutrition. Contact a health care provider if:  You are getting worse rather than better.  Your symptoms are not controlled by medicine.  You have chills.  You have worsening shortness of breath.  You have brown or red mucus.  You have yellow or brown nasal discharge.  You have pain in your face, especially when you bend forward.  You have a fever.  You have swollen neck glands.  You have pain while swallowing.  You have white areas in the back of your throat. Get help right away if:  You have severe or persistent:  Headache.  Ear pain.  Sinus pain.  Chest pain.  You have chronic lung disease and any of the following:  Wheezing.  Prolonged cough.  Coughing up blood.  A change in your usual mucus.  You have a stiff neck.  You have changes in your:  Vision.  Hearing.  Thinking.  Mood. This information is not intended to replace advice given to you by your health care provider. Make sure you discuss any questions you have with your health care provider. Document Released: 01/04/2001 Document Revised: 03/13/2016 Document Reviewed: 10/16/2013 Elsevier Interactive Patient Education  2017 Elsevier Inc.  

## 2017-05-08 NOTE — Progress Notes (Signed)
Subjective:    Patient ID: Leah Blevins, female    DOB: 11-27-1960, 56 y.o.   MRN: [id number]  05/08/2017  Fatigue (with chills and fever ) and Nausea (x 5 days )    HPI This 56 y.o. female presents for evaluation of fever, fatigue, nausea. Onset five days ago.  Tmax 102.  Head congestion; headache; also having dental pain due to part of tooth chipping off this week.  No ear pain or sore throat; +rhinorrhea and nasal congestion and coughing.  +nausea but no v/d.  No rash.  No gum swelling in mouth.  Will be taking a job in [place] for the next six months.   BP Readings from Last 3 Encounters:  05/08/17 118/66  04/01/17 104/71  03/08/17 127/90   Wt Readings from Last 3 Encounters:  05/08/17 253 lb (114.8 kg)  04/01/17 257 lb 3.2 oz (116.7 kg)  03/08/17 258 lb (117 kg)   Immunization History  Administered Date(s) Administered  . Influenza,inj,Quad PF,6+ Mos 04/01/2017  . Tdap 04/01/2017    Review of Systems  Constitutional: Positive for chills, fatigue and fever. Negative for diaphoresis.  HENT: Positive for congestion, dental problem, postnasal drip, rhinorrhea, sinus pain and sinus pressure. Negative for drooling, ear discharge, ear pain, facial swelling, hearing loss, sneezing, sore throat, tinnitus, trouble swallowing and voice change.   Eyes: Negative for visual disturbance.  Respiratory: Positive for cough. Negative for shortness of breath and wheezing.   Cardiovascular: Negative for chest pain, palpitations and leg swelling.  Gastrointestinal: Positive for nausea. Negative for abdominal pain, constipation, diarrhea and vomiting.  Endocrine: Negative for cold intolerance, heat intolerance, polydipsia, polyphagia and polyuria.  Neurological: Negative for dizziness, tremors, seizures, syncope, facial asymmetry, speech difficulty, weakness, light-headedness, numbness and headaches.    Past Medical History:  Diagnosis Date  . DVT (deep venous thrombosis) ([place]) 02/2014    . GERD (gastroesophageal reflux disease)   . HLD (hyperlipidemia)   . Morbid obesity with BMI of 40.0-44.9, adult ([place])   . Pneumonia 02/2014  . PVC (premature ventricular contraction)   . Sleep apnea    Past Surgical History:  Procedure Laterality Date  . ABDOMINAL HYSTERECTOMY  2005   uterine fibroids/DUB.  ovaries removed.  . [place]  . RHINOPLASTY    . SEPTOPLASTY Bilateral    Allergies  Allergen Reactions  . Keflex [Cephalexin] Hives  . Morphine And Related Nausea And Vomiting  . Penicillins Hives and Itching   Current Outpatient Prescriptions on File Prior to Visit  Medication Sig Dispense Refill  . apixaban (ELIQUIS) 5 MG TABS tablet Take 1 tablet (5 mg total) by mouth 2 (two) times daily. 180 tablet 1  . atorvastatin (LIPITOR) 10 MG tablet Take 1 tablet (10 mg total) by mouth daily. 90 tablet 1  . diclofenac sodium (VOLTAREN) 1 % GEL Apply 4 g topically 4 (four) times daily. 100 g 2  . metoprolol tartrate (LOPRESSOR) 25 MG tablet TAKE 1/2 TABLET BY MOUTH 2 TIMES DAILY. 90 tablet 1  . nitrofurantoin, macrocrystal-monohydrate, (MACROBID) 100 MG capsule Take 1 capsule (100 mg total) by mouth 2 (two) times daily. 14 capsule 0  . pantoprazole (PROTONIX) 20 MG tablet Take 1 tablet (20 mg total) by mouth daily. 90 tablet 1  . Triamcinolone Acetonide (TRIAMCINOLONE 0.1 % CREAM : EUCERIN) CREA Apply 1 application topically 2 (two) times daily as needed. 1:1 ratio 1 each 2  . triamcinolone cream (KENALOG) 0.1 % Apply 1 application topically 2 (two)  times daily. 453 g 1  . varenicline (CHANTIX) 1 MG tablet Take 1 tablet (1 mg total) by mouth 2 (two) times daily. 60 tablet 3   No current facility-administered medications on file prior to visit.    Social History   Social History  . Marital status: Married    Spouse name: N/A  . Number of children: 1  . Years of education: N/A   Occupational History  . workamper    Social History Main Topics  . Smoking  status: Current Some Day Smoker    Packs/day: 0.50    Years: 40.00    Types: Cigarettes    Last attempt to quit: 09/29/2015  . Smokeless tobacco: Never Used     Comment: vapor sig  . Alcohol use 0.0 oz/week     Comment: sometimes- beer  . Drug use: No  . Sexual activity: Yes   Other Topics Concern  . Not on file   Social History Narrative   Marital status:  Married      Children: 38 year old son; no grandchildren.      Lives: with husband; travels in [place]; home is [place].       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      Tobacco: 1 ppd x 23 years      Alcohol: rarely; beers      Exercise:  None         Family History  Problem Relation Age of Onset  . Diabetes Mother   . Heart disease Mother 67       CABG/CAD  . Hyperlipidemia Mother   . Hypertension Mother   . Colon polyps Mother   . Clotting disorder Mother   . Deep vein thrombosis Mother   . Cancer Father        vocal cords  . Heart disease Father 105       CABG  . Hyperlipidemia Father   . Hypertension Father   . Colon polyps Father   . Cancer Brother        vocal cords  . Heart disease Brother 40       CABG/CAD/stents  . Heart disease Sister        AMI but no stenting; atrial fibrillation  . Colon cancer Maternal Aunt   . Heart disease Brother        Atrial  fibrillation       Objective:    BP 118/66   Pulse 82   Temp 98 F (36.7 C) (Oral)   Resp 16   Ht 5' 8.11" (1.73 m)   Wt 253 lb (114.8 kg)   SpO2 95%   BMI 38.34 kg/m  Physical Exam  Constitutional: She is oriented to person, place, and time. She appears well-developed and well-nourished. No distress.  HENT:  Head: Normocephalic and atraumatic.  Right Ear: Tympanic membrane, external ear and ear canal normal.  Left Ear: Tympanic membrane, external ear and ear canal normal.  Nose: Right sinus exhibits maxillary sinus tenderness. Right sinus exhibits no frontal sinus tenderness. Left sinus  exhibits maxillary sinus tenderness. Left sinus exhibits no frontal sinus tenderness.  Mouth/Throat: Oropharynx is clear and moist and mucous membranes are normal. No oral lesions. Abnormal dentition. No dental abscesses, uvula swelling or dental caries.  Eyes: Pupils are equal, round, and reactive to light. Conjunctivae and EOM are normal.  Neck: Normal range of motion. Neck supple. Carotid bruit  is not present. No thyromegaly present.  Cardiovascular: Normal rate, regular rhythm, normal heart sounds and intact distal pulses.  Exam reveals no gallop and no friction rub.   No murmur heard. Pulmonary/Chest: Effort normal and breath sounds normal. She has no wheezes. She has no rales.  Abdominal: Soft. Bowel sounds are normal. She exhibits no distension and no mass. There is no tenderness. There is no rebound and no guarding.  Lymphadenopathy:    She has cervical adenopathy.  Neurological: She is alert and oriented to person, place, and time. No cranial nerve deficit.  Skin: Skin is warm and dry. No rash noted. She is not diaphoretic. No erythema. No pallor.  Psychiatric: She has a normal mood and affect. Her behavior is normal.   No results found. Depression screen [facility] 2/9 05/08/2017 04/01/2017 03/08/2017 04/08/2016 08/06/2015  Decreased Interest 0 0 0 0 0  Down, Depressed, Hopeless 0 0 0 0 0  PHQ - 2 Score 0 0 0 0 0   Fall Risk  05/08/2017 04/01/2017 03/08/2017 04/08/2016 08/06/2015  Falls in the past year? No No No No No   Results for orders placed or performed in visit on 05/08/17  POC Influenza A&B(BINAX/QUICKVUE)  Result Value Ref Range   Influenza A, POC Negative Negative   Influenza B, POC Negative Negative        Assessment & Plan:   1. Fever in other diseases   2. Pain, dental   3. Acute upper respiratory infection   4. Tobacco abuse    -New onset URI symptoms; supportive care with rest, Tylenol, Mucinex DMI. -New onset dental pain with disrupted/cracked tooth; rx for  Doxycycline provided to cover early sinusitis and any underlying dental infection; rx for Tramadol provided; can also take Tylenol; call dentist today for upcoming appointment.  Also recommend orajel.    Orders Placed This Encounter  Procedures  . POC Influenza A&B(BINAX/QUICKVUE)   Meds ordered this encounter  Medications  . doxycycline (VIBRAMYCIN) 100 MG capsule    Sig: Take 1 capsule (100 mg total) by mouth 2 (two) times daily.    Dispense:  20 capsule    Refill:  0  . ipratropium (ATROVENT) 0.03 % nasal spray    Sig: Place 2 sprays into both nostrils 2 (two) times daily.    Dispense:  30 mL    Refill:  1  . traMADol (ULTRAM) 50 MG tablet    Sig: Take 1-2 tablets (50-100 mg total) by mouth every 8 (eight) hours as needed.    Dispense:  30 tablet    Refill:  0    No Follow-up on file.   Keng Jewel Elayne Guerin, M.D. [facility] at [facility] [address] [place], [place]  [id number] [phone] phone [phone] fax

## 2017-05-26 ENCOUNTER — Other Ambulatory Visit: Payer: Self-pay | Admitting: Family Medicine

## 2017-05-26 DIAGNOSIS — Z720 Tobacco use: Secondary | ICD-10-CM

## 2017-09-12 ENCOUNTER — Encounter: Payer: Self-pay | Admitting: Family Medicine

## 2017-09-12 MED ORDER — METOPROLOL TARTRATE 25 MG PO TABS: ORAL_TABLET | ORAL | 0 refills | Status: AC

## 2017-09-12 MED ORDER — TRIAMCINOLONE 0.1 % CREAM:EUCERIN CREAM 1:1: 1.0000 "application " | TOPICAL_CREAM | Freq: Two times a day (BID) | CUTANEOUS | 2 refills | Status: AC | PRN

## 2017-09-12 MED ORDER — APIXABAN 5 MG PO TABS: 5.0000 mg | ORAL_TABLET | Freq: Two times a day (BID) | ORAL | 0 refills | Status: AC

## 2017-09-12 MED ORDER — ATORVASTATIN CALCIUM 10 MG PO TABS: 10.0000 mg | ORAL_TABLET | Freq: Every day | ORAL | 0 refills | Status: AC

## 2017-09-12 MED ORDER — PANTOPRAZOLE SODIUM 20 MG PO TBEC: 20.0000 mg | DELAYED_RELEASE_TABLET | Freq: Every day | ORAL | 0 refills | Status: AC

## 2017-09-12 NOTE — Telephone Encounter (Signed)
Dr. Tamala Julian, dispense 454 grams per patient photo? No gram amount specified in prescription, please advise.

## 2017-09-12 NOTE — Telephone Encounter (Signed)
Please call in Triamcinolone-Eucerin 1:1 mix to [place], [employer] [employer].

## 2017-09-13 ENCOUNTER — Encounter: Payer: PRIVATE HEALTH INSURANCE | Admitting: Family Medicine

## 2017-12-18 ENCOUNTER — Encounter: Payer: Self-pay | Admitting: Family Medicine

## 2017-12-25 ENCOUNTER — Encounter: Payer: Self-pay | Admitting: Family Medicine

## 2018-10-11 ENCOUNTER — Encounter: Payer: Self-pay | Admitting: Gastroenterology

## 2019-02-23 IMAGING — DX DG KNEE COMPLETE 4+V*R*
4 series · 4 of 4 positions shown · non-contrast
Comparison: None in PACs

CLINICAL DATA: Right lateral knee pain and swelling. Symptoms are
recurrent.

EXAM:
RIGHT KNEE - COMPLETE 4+ VIEW

[knee ap]
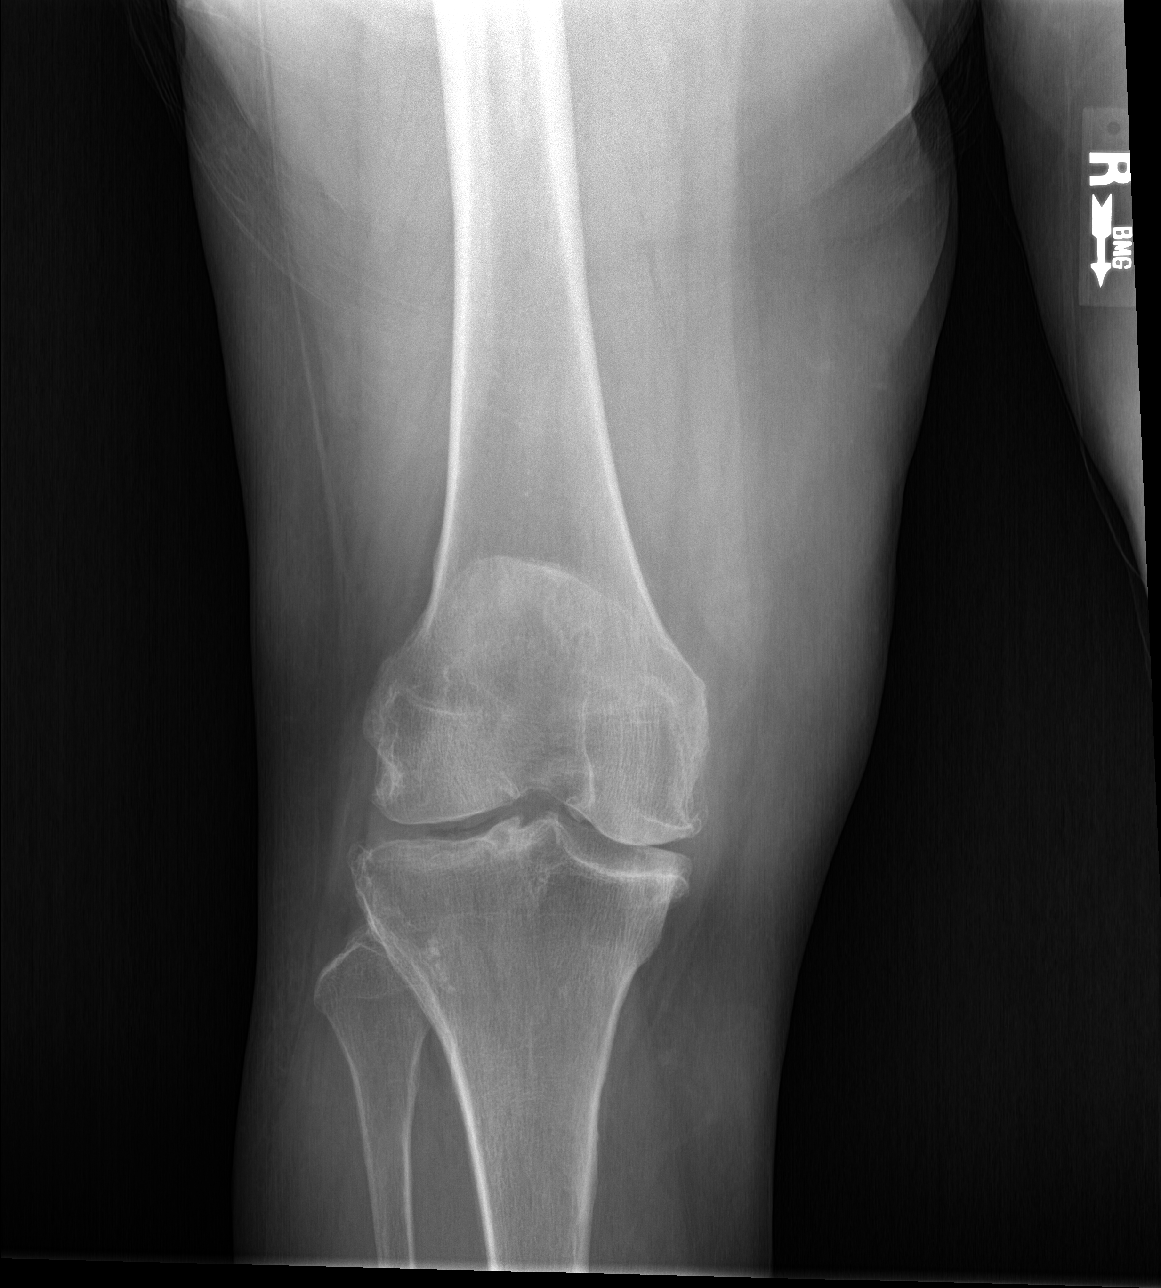

[knee lat]
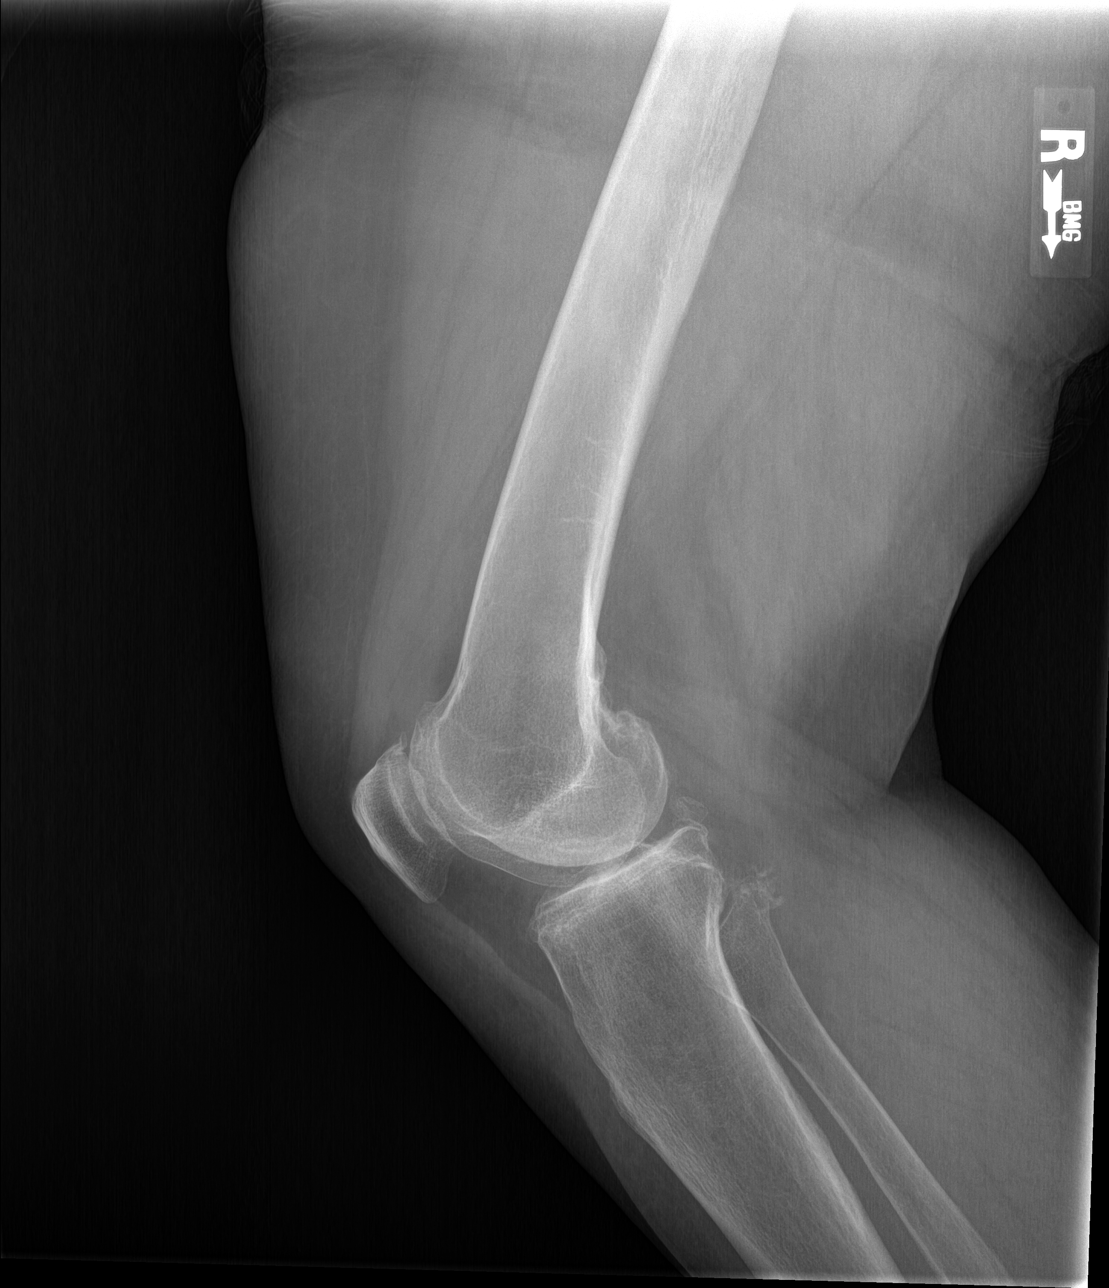

[sunrise]
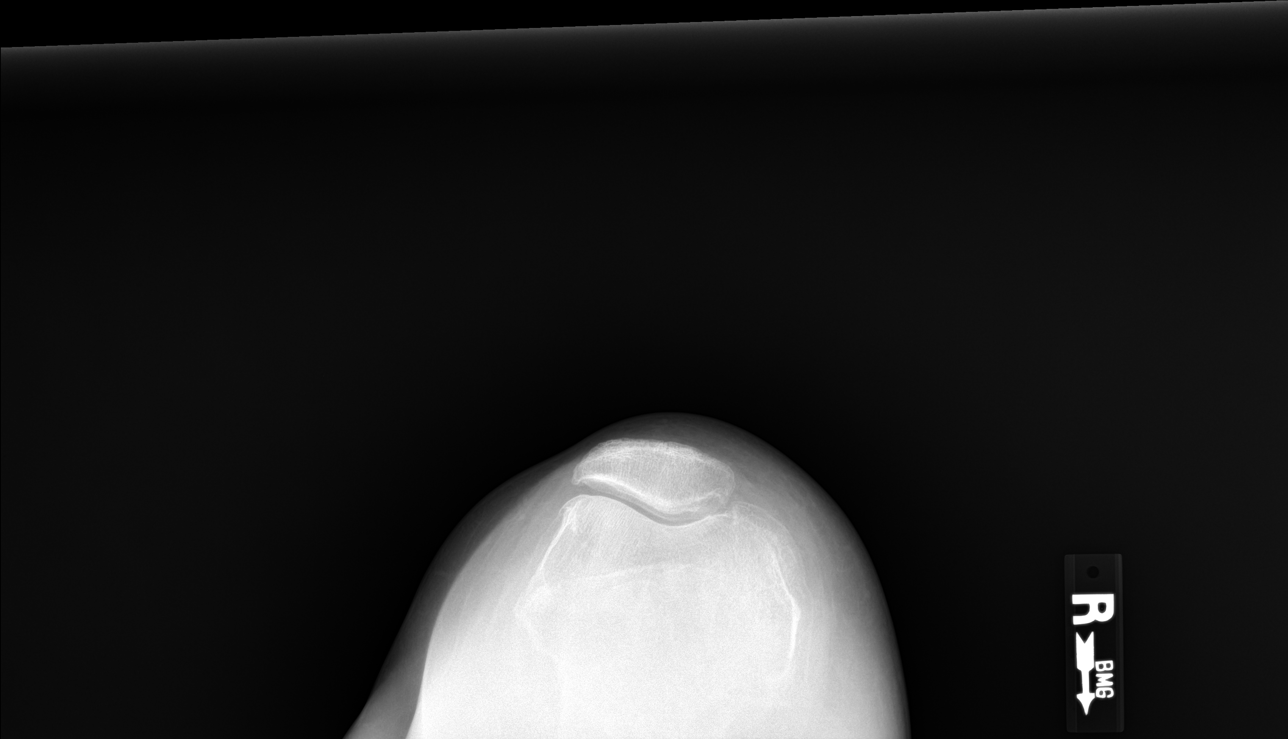

[knee [person_name]]
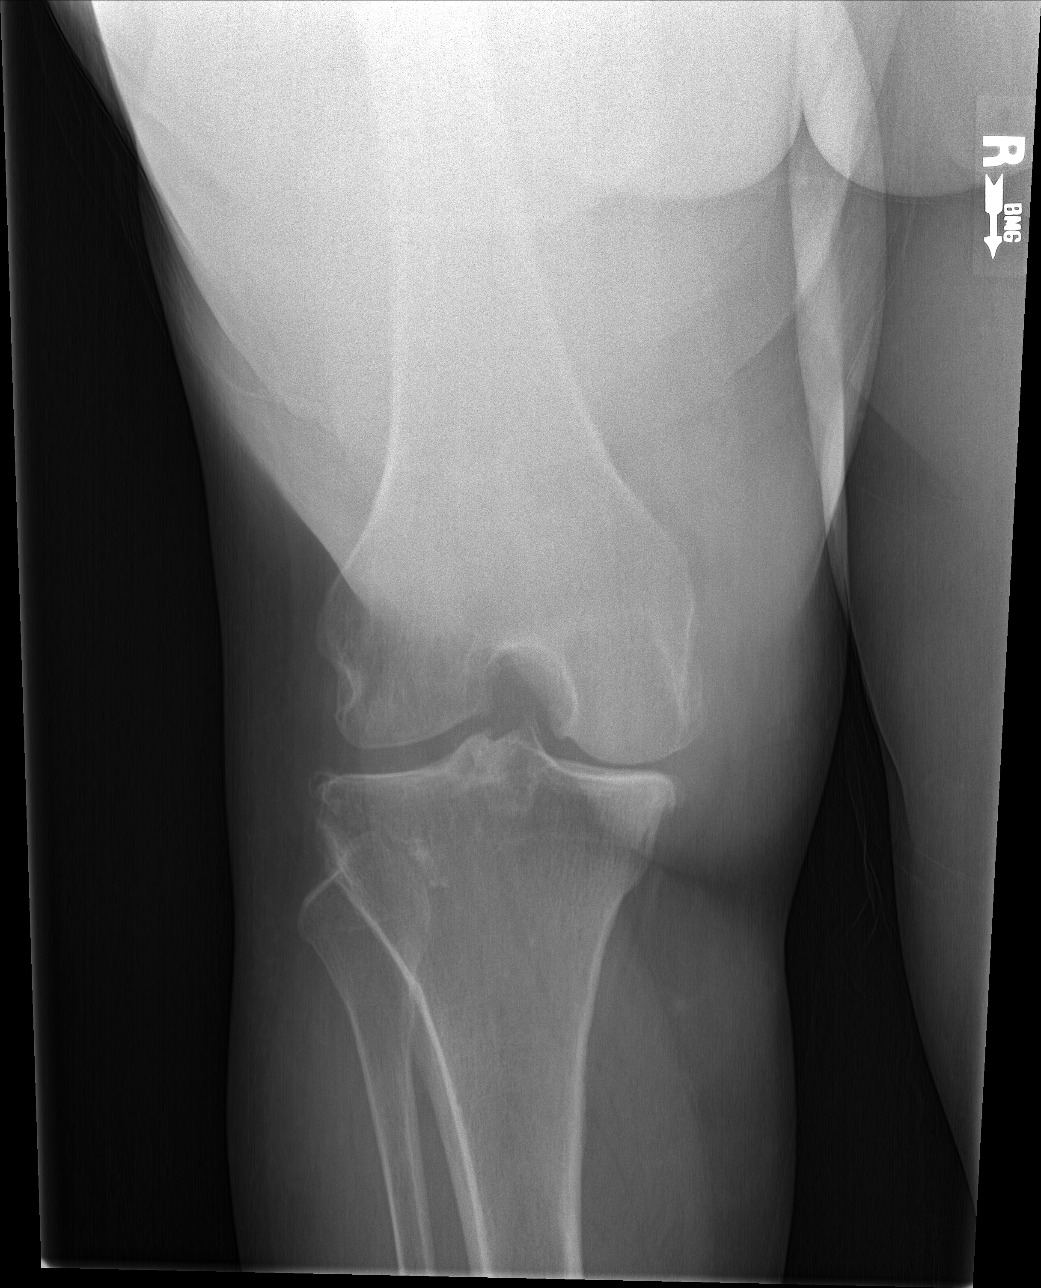

[4 of 4 positions shown; findings below may reference images not displayed]

FINDINGS: The bones are subjectively adequately mineralized. The joint spaces
are well maintained. There is beaking of the tibial spines. Spurs
arise from the articular margins of the tibial plateaus and medial
femoral condyle and the articular margins of the patella. There is
no acute or healing fracture. There is no dislocation or joint
effusion.
IMPRESSION: Moderate osteoarthritic spurring involving all 3 joint compartments.
No high-grade joint space loss. No acute bony abnormality.
